# Patient Record
Sex: Female | Born: 1955
Health system: Southern US, Community
[De-identification: ages and names within clinical notes are randomized; demographics above are authoritative.]

## PROBLEM LIST (undated history)

## (undated) DIAGNOSIS — E039 Hypothyroidism, unspecified: Secondary | ICD-10-CM

## (undated) DIAGNOSIS — J449 Chronic obstructive pulmonary disease, unspecified: Secondary | ICD-10-CM

## (undated) DIAGNOSIS — I219 Acute myocardial infarction, unspecified: Secondary | ICD-10-CM

## (undated) DIAGNOSIS — N289 Disorder of kidney and ureter, unspecified: Secondary | ICD-10-CM

## (undated) DIAGNOSIS — I509 Heart failure, unspecified: Secondary | ICD-10-CM

## (undated) DIAGNOSIS — Z951 Presence of aortocoronary bypass graft: Secondary | ICD-10-CM

## (undated) DIAGNOSIS — E079 Disorder of thyroid, unspecified: Secondary | ICD-10-CM

## (undated) DIAGNOSIS — H269 Unspecified cataract: Secondary | ICD-10-CM

## (undated) HISTORY — PX: CORONARY ARTERY BYPASS GRAFT: SHX141

## (undated) HISTORY — PX: TRACHEOSTOMY: SUR1362

## (undated) HISTORY — PX: CATARACT EXTRACTION: SUR2

## (undated) HISTORY — PX: GASTROSTOMY W/ FEEDING TUBE: SUR642

## (undated) HISTORY — PX: OTHER SURGICAL HISTORY: SHX169

## (undated) HISTORY — PX: INSERTION OF DIALYSIS CATHETER: SHX1324

## (undated) HISTORY — PX: CARDIAC SURGERY: SHX584

---

## 2014-06-22 ENCOUNTER — Inpatient Hospital Stay (HOSPITAL_COMMUNITY)
Admission: EM | Admit: 2014-06-22 | Discharge: 2014-06-29 | DRG: 291 | Disposition: A | Discharge status: Home Health | Payer: BC Managed Care – PPO | Attending: Internal Medicine | Admitting: Internal Medicine

## 2014-06-22 DIAGNOSIS — L89899 Pressure ulcer of other site, unspecified stage: Secondary | ICD-10-CM | POA: Diagnosis present

## 2014-06-22 DIAGNOSIS — J398 Other specified diseases of upper respiratory tract: Secondary | ICD-10-CM | POA: Diagnosis present

## 2014-06-22 DIAGNOSIS — I5021 Acute systolic (congestive) heart failure: Principal | ICD-10-CM | POA: Diagnosis present

## 2014-06-22 DIAGNOSIS — E039 Hypothyroidism, unspecified: Secondary | ICD-10-CM | POA: Diagnosis present

## 2014-06-22 DIAGNOSIS — D649 Anemia, unspecified: Secondary | ICD-10-CM | POA: Diagnosis present

## 2014-06-22 DIAGNOSIS — Z6841 Body Mass Index (BMI) 40.0 and over, adult: Secondary | ICD-10-CM

## 2014-06-22 DIAGNOSIS — J449 Chronic obstructive pulmonary disease, unspecified: Secondary | ICD-10-CM | POA: Diagnosis present

## 2014-06-22 DIAGNOSIS — L89153 Pressure ulcer of sacral region, stage 3: Secondary | ICD-10-CM | POA: Diagnosis present

## 2014-06-22 DIAGNOSIS — L89109 Pressure ulcer of unspecified part of back, unspecified stage: Secondary | ICD-10-CM | POA: Diagnosis present

## 2014-06-22 DIAGNOSIS — J9601 Acute respiratory failure with hypoxia: Secondary | ICD-10-CM | POA: Diagnosis present

## 2014-06-22 DIAGNOSIS — D631 Anemia in chronic kidney disease: Secondary | ICD-10-CM | POA: Diagnosis present

## 2014-06-22 DIAGNOSIS — I509 Heart failure, unspecified: Secondary | ICD-10-CM

## 2014-06-22 DIAGNOSIS — Z7982 Long term (current) use of aspirin: Secondary | ICD-10-CM

## 2014-06-22 DIAGNOSIS — Z951 Presence of aortocoronary bypass graft: Secondary | ICD-10-CM

## 2014-06-22 DIAGNOSIS — I340 Nonrheumatic mitral (valve) insufficiency: Secondary | ICD-10-CM | POA: Diagnosis present

## 2014-06-22 DIAGNOSIS — N183 Chronic kidney disease, stage 3 unspecified: Secondary | ICD-10-CM | POA: Diagnosis present

## 2014-06-22 DIAGNOSIS — Z7901 Long term (current) use of anticoagulants: Secondary | ICD-10-CM

## 2014-06-22 DIAGNOSIS — I2510 Atherosclerotic heart disease of native coronary artery without angina pectoris: Secondary | ICD-10-CM | POA: Diagnosis present

## 2014-06-22 DIAGNOSIS — L8990 Pressure ulcer of unspecified site, unspecified stage: Secondary | ICD-10-CM | POA: Diagnosis present

## 2014-06-22 DIAGNOSIS — E46 Unspecified protein-calorie malnutrition: Secondary | ICD-10-CM | POA: Diagnosis present

## 2014-06-22 DIAGNOSIS — Z87891 Personal history of nicotine dependence: Secondary | ICD-10-CM

## 2014-06-22 DIAGNOSIS — F419 Anxiety disorder, unspecified: Secondary | ICD-10-CM | POA: Diagnosis present

## 2014-06-22 DIAGNOSIS — Z86718 Personal history of other venous thrombosis and embolism: Secondary | ICD-10-CM

## 2014-06-22 DIAGNOSIS — R0602 Shortness of breath: Secondary | ICD-10-CM | POA: Diagnosis not present

## 2014-06-22 DIAGNOSIS — I252 Old myocardial infarction: Secondary | ICD-10-CM

## 2014-06-22 DIAGNOSIS — E8809 Other disorders of plasma-protein metabolism, not elsewhere classified: Secondary | ICD-10-CM | POA: Diagnosis present

## 2014-06-22 DIAGNOSIS — E876 Hypokalemia: Secondary | ICD-10-CM | POA: Diagnosis not present

## 2014-06-22 DIAGNOSIS — J189 Pneumonia, unspecified organism: Secondary | ICD-10-CM | POA: Diagnosis present

## 2014-06-22 DIAGNOSIS — L89819 Pressure ulcer of head, unspecified stage: Secondary | ICD-10-CM | POA: Diagnosis present

## 2014-06-22 DIAGNOSIS — J810 Acute pulmonary edema: Secondary | ICD-10-CM

## 2014-06-22 DIAGNOSIS — Z79899 Other long term (current) drug therapy: Secondary | ICD-10-CM

## 2014-06-22 HISTORY — DX: Disorder of kidney and ureter, unspecified: N28.9

## 2014-06-22 HISTORY — DX: Presence of aortocoronary bypass graft: Z95.1

## 2014-06-22 HISTORY — DX: Chronic obstructive pulmonary disease, unspecified: J44.9

## 2014-06-22 HISTORY — DX: Disorder of thyroid, unspecified: E07.9

## 2014-06-22 HISTORY — DX: Heart failure, unspecified: I50.9

## 2014-06-22 HISTORY — DX: Acute myocardial infarction, unspecified: I21.9

## 2014-06-22 HISTORY — DX: Hypothyroidism, unspecified: E03.9

## 2014-06-22 NOTE — ED Notes (Signed)
Patient arrived via [facility] from home with acute onset respiratory distress. EMS received call for a fall and found patient on the floor where she slid out of her chair onto the floor and tripod position in respiratory distress with sats 84%. Patient received albuterol 2.5mg  x1, solumedrol 125mg  by EMS and was placed on CPap with oxygen sats 97%. Patient was just discharged for [facility] two days ago per EMS post MI.

## 2014-06-22 NOTE — ED Provider Notes (Signed)
CSN: [id number]     Arrival date & time 06/22/14  2343 History   First MD Initiated Contact with Patient 06/22/14 2349     Chief Complaint  Patient presents with  . Respiratory Distress     (Consider location/radiation/quality/duration/timing/severity/associated sxs/prior Treatment) Patient is a 58 y.o. female presenting with shortness of breath.  Shortness of Breath Severity:  Severe Onset quality:  Gradual Duration:  2 days Timing:  Constant Progression:  Unchanged Chronicity:  Chronic Context comment:  Recent hospitalization for same Relieved by:  Nothing Worsened by:  Nothing tried Ineffective treatments:  None tried Associated symptoms: cough   Associated symptoms: no abdominal pain, no chest pain and no fever     Past Medical History  Diagnosis Date  . COPD (chronic obstructive pulmonary disease)   . MI (myocardial infarction)   . CHF (congestive heart failure)   . Hx of CABG   . Renal disorder   . Thyroid disease   . Hypothyroid    Past Surgical History  Procedure Laterality Date  . Insertion of dialysis catheter    . Cardiac surgery    . Tracheostomy    . Coronary artery bypass graft     Family History  Problem Relation Age of Onset  . CAD Neg Hx    History  Substance Use Topics  . Smoking status: Former Smoker    Quit date: 02/06/2014  . Smokeless tobacco: Never Used  . Alcohol Use: No   OB History   Grav Para Term Preterm Abortions TAB SAB Ect Mult Living                 Review of Systems  Unable to perform ROS: Acuity of condition  Constitutional: Negative for fever.  Respiratory: Positive for cough and shortness of breath.   Cardiovascular: Negative for chest pain.  Gastrointestinal: Negative for abdominal pain.      Allergies  Review of patient's allergies indicates no known allergies.  Home Medications   Prior to Admission medications   Medication Sig Start Date End Date Taking? Authorizing Provider  ALPRAZolam Duanne Moron) 1 MG  tablet Take 1 mg by mouth 2 (two) times daily.   Yes Historical Provider, MD  aspirin EC 81 MG tablet Take 81 mg by mouth daily.   Yes Historical Provider, MD  furosemide (LASIX) 40 MG tablet Take 40 mg by mouth daily.   Yes Historical Provider, MD  levothyroxine (SYNTHROID, LEVOTHROID) 125 MCG tablet Take 125 mcg by mouth daily before breakfast.   Yes Historical Provider, MD  metoprolol tartrate (LOPRESSOR) 25 MG tablet Take 12.5 mg by mouth 2 (two) times daily.   Yes Historical Provider, MD  morphine (MSIR) 15 MG tablet Take 15 mg by mouth every 4 (four) hours as needed for severe pain.   Yes Historical Provider, MD  pantoprazole (PROTONIX) 40 MG tablet Take 40 mg by mouth daily.   Yes Historical Provider, MD  warfarin (COUMADIN) 4 MG tablet Take 4 mg by mouth daily.   Yes Historical Provider, MD   BP 98/64  Pulse 79  Temp(Src) 99.9 F (37.7 C) (Oral)  Resp 20  SpO2 98% Physical Exam  Vitals reviewed. Constitutional: She is oriented to person, place, and time. She appears well-developed and well-nourished.  HENT:  Head: Normocephalic and atraumatic.  Right Ear: External ear normal.  Left Ear: External ear normal.  Eyes: Conjunctivae and EOM are normal. Pupils are equal, round, and reactive to light.  Neck: Normal range of motion. Neck supple.  Cardiovascular: Regular rhythm, normal heart sounds and intact distal pulses.  Tachycardia present.   Pulmonary/Chest: Accessory muscle usage present. Tachypnea noted. She is in respiratory distress. She has wheezes (diffuse).  Abdominal: Soft. Bowel sounds are normal. There is no tenderness.  Musculoskeletal: Normal range of motion.  Neurological: She is alert and oriented to person, place, and time.  Skin: Skin is warm and dry.  Ulceration to lumbar area, R tib    ED Course  Procedures (including critical care time) Labs Review Labs Reviewed  COMPREHENSIVE METABOLIC PANEL - Abnormal; Notable for the following:    Glucose, Bld 156 (*)     Creatinine, Ser 1.58 (*)    Albumin 2.8 (*)    GFR calc non Af Amer 35 (*)    GFR calc Af Amer 41 (*)    All other components within normal limits  CBC WITH DIFFERENTIAL - Abnormal; Notable for the following:    WBC 11.2 (*)    RBC 3.03 (*)    Hemoglobin 9.4 (*)    HCT 29.5 (*)    RDW 16.0 (*)    Platelets 415 (*)    Neutrophils Relative % 83 (*)    Neutro Abs 9.3 (*)    Lymphocytes Relative 10 (*)    All other components within normal limits  PRO B NATRIURETIC PEPTIDE - Abnormal; Notable for the following:    Pro B Natriuretic peptide (BNP) 12034.0 (*)    All other components within normal limits  PROTIME-INR - Abnormal; Notable for the following:    Prothrombin Time 22.6 (*)    INR 1.97 (*)    All other components within normal limits  I-STAT ARTERIAL BLOOD GAS, ED - Abnormal; Notable for the following:    pO2, Arterial 71.0 (*)    Bicarbonate 25.9 (*)    All other components within normal limits  CULTURE, BLOOD (ROUTINE X 2)  CULTURE, BLOOD (ROUTINE X 2)  URINALYSIS, ROUTINE W REFLEX MICROSCOPIC  TROPONIN I  LACTIC ACID, PLASMA  I-STAT CG4 LACTIC ACID, ED    Imaging Review Ct Angio Chest W/cm &/or Wo Cm  06/23/2014   CLINICAL DATA:  Respiratory distress and hypoxia.  EXAM: CT ANGIOGRAPHY CHEST WITH CONTRAST  TECHNIQUE: Multidetector CT imaging of the chest was performed using the standard protocol during bolus administration of intravenous contrast. Multiplanar CT image reconstructions and MIPs were obtained to evaluate the vascular anatomy.  CONTRAST:  8mL OMNIPAQUE IOHEXOL 350 MG/ML SOLN  COMPARISON:  None.  FINDINGS: THORACIC INLET/BODY WALL:  Dialysis catheter from the right, with tip at the upper right atrium.  There is scarring in keeping with a previous tracheostomy. There is narrowing of the trachea at the thoracic inlet to 9 x 4 mm. No infiltrating mass is seen in this region.  MEDIASTINUM:  Cardiomegaly, with notable enlargement of the left heart. There is  diffuse atherosclerosis, including the coronary arteries. The patient is status post CABG. Contrast timing results in limited opacification of the aorta and saphenous grafts. No evidence for aortic dissection or aneurysm. There is no evidence for pulmonary embolism. No adenopathy.  LUNG WINDOWS:  Small, predominantly layering bilateral pleural effusions with passive atelectasis. There is mild interlobular septal thickening at the bases. Patchy opacity in the upper lobes which roughly conform to secondary pulmonary lobules. These are fairly symmetric and distribution.  UPPER ABDOMEN:  No acute findings.  OSSEOUS:  No acute fracture.  No suspicious lytic or blastic lesions.  Review of the MIP images confirms the  above findings.  IMPRESSION: 1. Negative for pulmonary embolism. 2. High-grade tracheal stenosis at the thoracic inlet, likely scarring from remote tracheostomy tube. Minimal diameter measures 9 x 4 mm. 3. Patchy bilateral airspace disease favors pulmonary edema over atypical infection. There are small bilateral pleural effusions.   Electronically Signed   By: Jorje Guild M.D.   On: 06/23/2014 03:42   Dg Chest Portable 1 View  06/23/2014   CLINICAL DATA:  Respiratory distress.  Initial encounter.  EXAM: PORTABLE CHEST - 1 VIEW  COMPARISON:  02/16/2014  FINDINGS: Grossly unchanged enlarged cardiac silhouette and mediastinal contours. Post median sternotomy and CABG. Interval placement of a right subclavian vein approach dialysis catheter with tip projected over the superior cavoatrial junction. The pulmonary vasculature is indistinct with cephalization of flow. Interval development small bilateral effusions and associated bibasilar heterogeneous/consolidative opacities, left greater than right. No pneumothorax. Unchanged bones.  IMPRESSION: Findings worrisome for pulmonary edema with small bilateral effusions and associated bibasilar opacities, left greater than right, atelectasis versus infiltrate.    Electronically Signed   By: Sandi Mariscal M.D.   On: 06/23/2014 00:44     EKG Interpretation   Date/Time:  Thursday June 22 2014 23:52:09 EDT Ventricular Rate:  97 PR Interval:  133 QRS Duration: 105 QT Interval:  353 QTC Calculation: 448 R Axis:   -28 Text Interpretation:  Sinus rhythm Ventricular premature complex  Borderline left axis deviation Low voltage, extremity leads Abnormal  R-wave progression, early transition Nonspecific T abnormalities, lateral  leads No old tracing to compare Confirmed by Debby Freiberg [phone]) on  06/23/2014 1:11:51 AM      MDM   Final diagnoses:  CHF exacerbation  Anemia, unspecified anemia type  CAD in native artery  Hypothyroidism, unspecified hypothyroidism type    58 y.o. female with pertinent PMH of COPD, CHF, CAD (recent MI in 02/2014 with prolonged respiratory failure and trach) presents with recurrent respiratory symptoms beginning at 9 PM this afternoon. Patient has been short of breath for some time, however became too weak to ambulate or stand, EMS was then called. On EMS arrival patient to 84% on room air.  Pt was given albuterol and solumedrol with improvement to 9%% on 6L.  On arrival she had vitals and physical exam as above.  Blood gas obtained and with hypoxia. CT scan obtained to ro PE.  This demonstrated no PE.  Labs and exam consistent with CHF.  Lasix given.  Consulted medicine for admission after abx to cover for possible PNA.  1. CHF exacerbation   2. Anemia, unspecified anemia type   3. CAD in native artery   4. Hypothyroidism, unspecified hypothyroidism type         Debby Freiberg, MD 06/23/14 [phone]

## 2014-06-23 ENCOUNTER — Emergency Department (HOSPITAL_COMMUNITY): Payer: BC Managed Care – PPO

## 2014-06-23 ENCOUNTER — Encounter (HOSPITAL_COMMUNITY): Payer: Self-pay | Admitting: Emergency Medicine

## 2014-06-23 DIAGNOSIS — J449 Chronic obstructive pulmonary disease, unspecified: Secondary | ICD-10-CM | POA: Diagnosis present

## 2014-06-23 DIAGNOSIS — I340 Nonrheumatic mitral (valve) insufficiency: Secondary | ICD-10-CM | POA: Diagnosis present

## 2014-06-23 DIAGNOSIS — N183 Chronic kidney disease, stage 3 unspecified: Secondary | ICD-10-CM | POA: Diagnosis present

## 2014-06-23 DIAGNOSIS — Z7982 Long term (current) use of aspirin: Secondary | ICD-10-CM | POA: Diagnosis not present

## 2014-06-23 DIAGNOSIS — L8990 Pressure ulcer of unspecified site, unspecified stage: Secondary | ICD-10-CM | POA: Diagnosis present

## 2014-06-23 DIAGNOSIS — R0602 Shortness of breath: Secondary | ICD-10-CM | POA: Diagnosis present

## 2014-06-23 DIAGNOSIS — L89109 Pressure ulcer of unspecified part of back, unspecified stage: Secondary | ICD-10-CM | POA: Diagnosis present

## 2014-06-23 DIAGNOSIS — E46 Unspecified protein-calorie malnutrition: Secondary | ICD-10-CM | POA: Diagnosis present

## 2014-06-23 DIAGNOSIS — L89819 Pressure ulcer of head, unspecified stage: Secondary | ICD-10-CM | POA: Diagnosis present

## 2014-06-23 DIAGNOSIS — Z86718 Personal history of other venous thrombosis and embolism: Secondary | ICD-10-CM

## 2014-06-23 DIAGNOSIS — Z6841 Body Mass Index (BMI) 40.0 and over, adult: Secondary | ICD-10-CM | POA: Diagnosis not present

## 2014-06-23 DIAGNOSIS — I5021 Acute systolic (congestive) heart failure: Secondary | ICD-10-CM | POA: Diagnosis present

## 2014-06-23 DIAGNOSIS — J9601 Acute respiratory failure with hypoxia: Secondary | ICD-10-CM | POA: Diagnosis present

## 2014-06-23 DIAGNOSIS — E8809 Other disorders of plasma-protein metabolism, not elsewhere classified: Secondary | ICD-10-CM | POA: Diagnosis present

## 2014-06-23 DIAGNOSIS — E039 Hypothyroidism, unspecified: Secondary | ICD-10-CM | POA: Diagnosis present

## 2014-06-23 DIAGNOSIS — Z7901 Long term (current) use of anticoagulants: Secondary | ICD-10-CM | POA: Diagnosis not present

## 2014-06-23 DIAGNOSIS — Z87891 Personal history of nicotine dependence: Secondary | ICD-10-CM | POA: Diagnosis not present

## 2014-06-23 DIAGNOSIS — J398 Other specified diseases of upper respiratory tract: Secondary | ICD-10-CM | POA: Diagnosis present

## 2014-06-23 DIAGNOSIS — D631 Anemia in chronic kidney disease: Secondary | ICD-10-CM | POA: Diagnosis present

## 2014-06-23 DIAGNOSIS — Z951 Presence of aortocoronary bypass graft: Secondary | ICD-10-CM

## 2014-06-23 DIAGNOSIS — I252 Old myocardial infarction: Secondary | ICD-10-CM

## 2014-06-23 DIAGNOSIS — L89899 Pressure ulcer of other site, unspecified stage: Secondary | ICD-10-CM | POA: Diagnosis present

## 2014-06-23 DIAGNOSIS — L89153 Pressure ulcer of sacral region, stage 3: Secondary | ICD-10-CM | POA: Diagnosis present

## 2014-06-23 DIAGNOSIS — J189 Pneumonia, unspecified organism: Secondary | ICD-10-CM | POA: Diagnosis present

## 2014-06-23 DIAGNOSIS — D649 Anemia, unspecified: Secondary | ICD-10-CM

## 2014-06-23 DIAGNOSIS — I2510 Atherosclerotic heart disease of native coronary artery without angina pectoris: Secondary | ICD-10-CM | POA: Diagnosis present

## 2014-06-23 DIAGNOSIS — F419 Anxiety disorder, unspecified: Secondary | ICD-10-CM | POA: Diagnosis present

## 2014-06-23 DIAGNOSIS — Z79899 Other long term (current) drug therapy: Secondary | ICD-10-CM | POA: Diagnosis not present

## 2014-06-23 DIAGNOSIS — E876 Hypokalemia: Secondary | ICD-10-CM | POA: Diagnosis not present

## 2014-06-23 DIAGNOSIS — I509 Heart failure, unspecified: Secondary | ICD-10-CM

## 2014-06-23 LAB — I-STAT ARTERIAL BLOOD GAS, ED
Acid-Base Excess: 1 mmol/L (ref 0.0–2.0)
BICARBONATE: 25.9 meq/L — AB (ref 20.0–24.0)
O2 SAT: 94 %
PCO2 ART: 42.1 mmHg (ref 35.0–45.0)
PH ART: 7.396 (ref 7.350–7.450)
PO2 ART: 71 mmHg — AB (ref 80.0–100.0)
Patient temperature: 98.6
TCO2: 27 mmol/L (ref 0–100)

## 2014-06-23 LAB — CBC WITH DIFFERENTIAL/PLATELET
BASOS ABS: 0 10*3/uL (ref 0.0–0.1)
BASOS PCT: 1 % (ref 0–1)
Basophils Absolute: 0.1 10*3/uL (ref 0.0–0.1)
Basophils Relative: 0 % (ref 0–1)
EOS ABS: 0.2 10*3/uL (ref 0.0–0.7)
EOS PCT: 1 % (ref 0–5)
Eosinophils Absolute: 0 10*3/uL (ref 0.0–0.7)
Eosinophils Relative: 0 % (ref 0–5)
HCT: 29.5 % — ABNORMAL LOW (ref 36.0–46.0)
HEMATOCRIT: 27.8 % — AB (ref 36.0–46.0)
HEMOGLOBIN: 9.4 g/dL — AB (ref 12.0–15.0)
Hemoglobin: 8.8 g/dL — ABNORMAL LOW (ref 12.0–15.0)
LYMPHS ABS: 1.1 10*3/uL (ref 0.7–4.0)
LYMPHS PCT: 10 % — AB (ref 12–46)
Lymphocytes Relative: 10 % — ABNORMAL LOW (ref 12–46)
Lymphs Abs: 0.9 10*3/uL (ref 0.7–4.0)
MCH: 30.3 pg (ref 26.0–34.0)
MCH: 31 pg (ref 26.0–34.0)
MCHC: 31.7 g/dL (ref 30.0–36.0)
MCHC: 31.9 g/dL (ref 30.0–36.0)
MCV: 95.9 fL (ref 78.0–100.0)
MCV: 97.4 fL (ref 78.0–100.0)
MONO ABS: 0.3 10*3/uL (ref 0.1–1.0)
MONOS PCT: 5 % (ref 3–12)
Monocytes Absolute: 0.6 10*3/uL (ref 0.1–1.0)
Monocytes Relative: 3 % (ref 3–12)
NEUTROS PCT: 87 % — AB (ref 43–77)
Neutro Abs: 7.9 10*3/uL — ABNORMAL HIGH (ref 1.7–7.7)
Neutro Abs: 9.3 10*3/uL — ABNORMAL HIGH (ref 1.7–7.7)
Neutrophils Relative %: 83 % — ABNORMAL HIGH (ref 43–77)
PLATELETS: 415 10*3/uL — AB (ref 150–400)
Platelets: 378 10*3/uL (ref 150–400)
RBC: 2.9 MIL/uL — ABNORMAL LOW (ref 3.87–5.11)
RBC: 3.03 MIL/uL — ABNORMAL LOW (ref 3.87–5.11)
RDW: 15.5 % (ref 11.5–15.5)
RDW: 16 % — ABNORMAL HIGH (ref 11.5–15.5)
WBC: 11.2 10*3/uL — ABNORMAL HIGH (ref 4.0–10.5)
WBC: 9 10*3/uL (ref 4.0–10.5)

## 2014-06-23 LAB — PROTIME-INR
INR: 1.97 — ABNORMAL HIGH (ref 0.00–1.49)
PROTHROMBIN TIME: 22.6 s — AB (ref 11.6–15.2)

## 2014-06-23 LAB — COMPREHENSIVE METABOLIC PANEL
ALBUMIN: 2.8 g/dL — AB (ref 3.5–5.2)
ALK PHOS: 59 U/L (ref 39–117)
ALT: 11 U/L (ref 0–35)
ALT: 12 U/L (ref 0–35)
ANION GAP: 13 (ref 5–15)
AST: 13 U/L (ref 0–37)
AST: 16 U/L (ref 0–37)
Albumin: 2.8 g/dL — ABNORMAL LOW (ref 3.5–5.2)
Alkaline Phosphatase: 55 U/L (ref 39–117)
Anion gap: 13 (ref 5–15)
BILIRUBIN TOTAL: 0.3 mg/dL (ref 0.3–1.2)
BUN: 17 mg/dL (ref 6–23)
BUN: 21 mg/dL (ref 6–23)
CHLORIDE: 102 meq/L (ref 96–112)
CO2: 24 mEq/L (ref 19–32)
CO2: 26 mEq/L (ref 19–32)
Calcium: 8.7 mg/dL (ref 8.4–10.5)
Calcium: 9.1 mg/dL (ref 8.4–10.5)
Chloride: 103 mEq/L (ref 96–112)
Creatinine, Ser: 1.58 mg/dL — ABNORMAL HIGH (ref 0.50–1.10)
Creatinine, Ser: 1.68 mg/dL — ABNORMAL HIGH (ref 0.50–1.10)
GFR calc Af Amer: 38 mL/min — ABNORMAL LOW (ref >90)
GFR calc non Af Amer: 33 mL/min — ABNORMAL LOW (ref >90)
GFR, EST AFRICAN AMERICAN: 41 mL/min — AB (ref >90)
GFR, EST NON AFRICAN AMERICAN: 35 mL/min — AB (ref >90)
GLUCOSE: 156 mg/dL — AB (ref 70–99)
Glucose, Bld: 179 mg/dL — ABNORMAL HIGH (ref 70–99)
POTASSIUM: 4.1 meq/L (ref 3.7–5.3)
Potassium: 4.7 mEq/L (ref 3.7–5.3)
Sodium: 140 mEq/L (ref 137–147)
Sodium: 141 mEq/L (ref 137–147)
TOTAL PROTEIN: 6.3 g/dL (ref 6.0–8.3)
Total Bilirubin: 0.3 mg/dL (ref 0.3–1.2)
Total Protein: 6.4 g/dL (ref 6.0–8.3)

## 2014-06-23 LAB — TROPONIN I: Troponin I: <0.3 ng/mL (ref <0.30)

## 2014-06-23 LAB — I-STAT CG4 LACTIC ACID, ED: Lactic Acid, Venous: 0.56 mmol/L (ref 0.5–2.2)

## 2014-06-23 LAB — URINALYSIS, ROUTINE W REFLEX MICROSCOPIC
Bilirubin Urine: NEGATIVE
GLUCOSE, UA: NEGATIVE mg/dL
HGB URINE DIPSTICK: NEGATIVE
KETONES UR: NEGATIVE mg/dL
Leukocytes, UA: NEGATIVE
Nitrite: NEGATIVE
PROTEIN: NEGATIVE mg/dL
Specific Gravity, Urine: 1.009 (ref 1.005–1.030)
UROBILINOGEN UA: 0.2 mg/dL (ref 0.0–1.0)
pH: 5 (ref 5.0–8.0)

## 2014-06-23 LAB — INFLUENZA PANEL BY PCR (TYPE A & B)
H1N1 flu by pcr: NOT DETECTED
Influenza A By PCR: NEGATIVE
Influenza B By PCR: NEGATIVE

## 2014-06-23 LAB — LACTIC ACID, PLASMA: Lactic Acid, Venous: 0.8 mmol/L (ref 0.5–2.2)

## 2014-06-23 LAB — HEMOGLOBIN A1C
Hgb A1c MFr Bld: 6 % — ABNORMAL HIGH
Mean Plasma Glucose: 126 mg/dL — ABNORMAL HIGH

## 2014-06-23 LAB — TSH: TSH: 6.84 u[IU]/mL — AB (ref 0.350–4.500)

## 2014-06-23 LAB — MRSA PCR SCREENING: MRSA by PCR: NEGATIVE

## 2014-06-23 LAB — PRO B NATRIURETIC PEPTIDE: Pro B Natriuretic peptide (BNP): 12034 pg/mL — ABNORMAL HIGH (ref 0–125)

## 2014-06-23 MED ORDER — VANCOMYCIN HCL 10 G IV SOLR
1250.0000 mg | INTRAVENOUS | Status: DC
  Administered 2014-06-24: 1250 mg via INTRAVENOUS
  Filled 2014-06-23: qty 1250

## 2014-06-23 MED ORDER — DEXTROSE 5 % IV SOLN
2.0000 g | INTRAVENOUS | Status: DC
  Administered 2014-06-23: 2 g via INTRAVENOUS
  Filled 2014-06-23 (×3): qty 2

## 2014-06-23 MED ORDER — ACETAMINOPHEN 650 MG RE SUPP: 650.0000 mg | Freq: Four times a day (QID) | RECTAL | Status: DC | PRN

## 2014-06-23 MED ORDER — FUROSEMIDE 10 MG/ML IJ SOLN
40.0000 mg | Freq: Two times a day (BID) | INTRAMUSCULAR | Status: DC
  Administered 2014-06-23: 40 mg via INTRAVENOUS
  Filled 2014-06-23: qty 4

## 2014-06-23 MED ORDER — ALPRAZOLAM 0.5 MG PO TABS
1.0000 mg | ORAL_TABLET | Freq: Two times a day (BID) | ORAL | Status: DC
  Administered 2014-06-23 – 2014-06-29 (×12): 1 mg via ORAL
  Filled 2014-06-23 (×6): qty 2
  Filled 2014-06-23: qty 4
  Filled 2014-06-23 (×6): qty 2

## 2014-06-23 MED ORDER — SODIUM CHLORIDE 0.9 % IJ SOLN
3.0000 mL | Freq: Two times a day (BID) | INTRAMUSCULAR | Status: DC
  Administered 2014-06-23 – 2014-06-29 (×12): 3 mL via INTRAVENOUS

## 2014-06-23 MED ORDER — ONDANSETRON HCL 4 MG PO TABS: 4.0000 mg | ORAL_TABLET | Freq: Four times a day (QID) | ORAL | Status: DC | PRN

## 2014-06-23 MED ORDER — FUROSEMIDE 10 MG/ML IJ SOLN
40.0000 mg | Freq: Once | INTRAMUSCULAR | Status: AC
  Administered 2014-06-23: 40 mg via INTRAVENOUS
  Filled 2014-06-23: qty 4

## 2014-06-23 MED ORDER — WARFARIN SODIUM 6 MG PO TABS
6.0000 mg | ORAL_TABLET | Freq: Once | ORAL | Status: AC
  Administered 2014-06-23: 6 mg via ORAL
  Filled 2014-06-23 (×2): qty 1

## 2014-06-23 MED ORDER — MORPHINE SULFATE 15 MG PO TABS
15.0000 mg | ORAL_TABLET | ORAL | Status: DC | PRN
  Administered 2014-06-23 – 2014-06-28 (×6): 15 mg via ORAL
  Filled 2014-06-23 (×6): qty 1

## 2014-06-23 MED ORDER — ACETAMINOPHEN 325 MG PO TABS: 650.0000 mg | ORAL_TABLET | Freq: Four times a day (QID) | ORAL | Status: DC | PRN

## 2014-06-23 MED ORDER — FUROSEMIDE 10 MG/ML IJ SOLN: 40.0000 mg | Freq: Every day | INTRAMUSCULAR | Status: DC

## 2014-06-23 MED ORDER — METOPROLOL TARTRATE 12.5 MG HALF TABLET
12.5000 mg | ORAL_TABLET | Freq: Two times a day (BID) | ORAL | Status: DC
  Administered 2014-06-23 – 2014-06-24 (×2): 12.5 mg via ORAL
  Filled 2014-06-23 (×4): qty 1

## 2014-06-23 MED ORDER — PIPERACILLIN-TAZOBACTAM 3.375 G IVPB 30 MIN
3.3750 g | Freq: Once | INTRAVENOUS | Status: AC
  Administered 2014-06-23: 3.375 g via INTRAVENOUS
  Filled 2014-06-23: qty 50

## 2014-06-23 MED ORDER — ASPIRIN EC 81 MG PO TBEC
81.0000 mg | DELAYED_RELEASE_TABLET | Freq: Every day | ORAL | Status: DC
  Administered 2014-06-23: 81 mg via ORAL
  Filled 2014-06-23: qty 1

## 2014-06-23 MED ORDER — ONDANSETRON HCL 4 MG/2ML IJ SOLN: 4.0000 mg | Freq: Four times a day (QID) | INTRAMUSCULAR | Status: DC | PRN

## 2014-06-23 MED ORDER — FUROSEMIDE 10 MG/ML IJ SOLN
80.0000 mg | Freq: Two times a day (BID) | INTRAMUSCULAR | Status: DC
  Administered 2014-06-23 – 2014-06-25 (×5): 80 mg via INTRAVENOUS
  Filled 2014-06-23 (×7): qty 8

## 2014-06-23 MED ORDER — PANTOPRAZOLE SODIUM 40 MG PO TBEC
40.0000 mg | DELAYED_RELEASE_TABLET | Freq: Every day | ORAL | Status: DC
  Administered 2014-06-23 – 2014-06-29 (×7): 40 mg via ORAL
  Filled 2014-06-23 (×7): qty 1

## 2014-06-23 MED ORDER — WARFARIN - PHARMACIST DOSING INPATIENT
Freq: Every day | Status: DC
  Administered 2014-06-23 – 2014-06-27 (×5)

## 2014-06-23 MED ORDER — ASPIRIN EC 81 MG PO TBEC
81.0000 mg | DELAYED_RELEASE_TABLET | Freq: Every day | ORAL | Status: DC
  Administered 2014-06-24 – 2014-06-29 (×6): 81 mg via ORAL
  Filled 2014-06-23 (×7): qty 1

## 2014-06-23 MED ORDER — LEVOTHYROXINE SODIUM 125 MCG PO TABS
125.0000 ug | ORAL_TABLET | Freq: Every day | ORAL | Status: DC
  Administered 2014-06-24 – 2014-06-29 (×6): 125 ug via ORAL
  Filled 2014-06-23 (×7): qty 1

## 2014-06-23 MED ORDER — IOHEXOL 350 MG/ML SOLN
100.0000 mL | Freq: Once | INTRAVENOUS | Status: AC | PRN
  Administered 2014-06-23: 80 mL via INTRAVENOUS

## 2014-06-23 MED ORDER — VANCOMYCIN HCL 10 G IV SOLR
1500.0000 mg | Freq: Once | INTRAVENOUS | Status: AC
  Administered 2014-06-23: 1500 mg via INTRAVENOUS
  Filled 2014-06-23: qty 1500

## 2014-06-23 NOTE — ED Notes (Signed)
Patient has a wound to her right lower leg that is a half dollar in size has packing in place that from where they harvested her vein for CABG, area is red with yellow/green pus and warm. She has a blister just below it that is red and apprx 2-3 cm around. She has a wound to her lower back family states was from previous hospital that has a dressing in place. She also has a wound to the back of her head that is red and is apprx 1-2 cm around.

## 2014-06-23 NOTE — ED Notes (Signed)
Patient states she had a panic attack earlier and ever since she has not been able to catch her breath. She was trying to stand to go to the restroom with help and she just became weak and slid to the floor with family help. Denies hitting her head. No injury noted.

## 2014-06-23 NOTE — ED Notes (Signed)
Admitting md at bedside to eval pt 

## 2014-06-23 NOTE — Progress Notes (Signed)
[name]  Laura Lane OIT:[id number] DOB: 05/06/1956 DOA: 06/22/2014 PCP: No primary provider on file.  Brief narrative: 58 year old female patient with significant history of prolonged hospitalization at [facility] beginning in June of 2015. The admit started with an acute MI. Patient had a cardiac catheterization which was complicated by a coronary artery dissection prompting emergency bypass procedure. Patient had prolonged mechanical ventilation that required tracheostomy. She also had acute renal failure requiring dialysis. In addition she was found to have a right upper extremity DVT and remains on Coumadin for this. She was discharged one week prior to this admit from a rehabilitation facility and sent home. On the night of admission patient developed shortness of breath. Apparently this had been subtly present at time of discharge but had worsened on the night of admission. Because of this she came to the emergency department.  In the ER patient was hypoxemic on room air. CT of the chest had findings concerning for CHF versus atypical infection. She was given a dose of IV Lasix in the ER. She was also mildly febrile so blood cultures were obtained and giving her respiratory findings there was concern she may have pneumonia so she was started on empiric broad-spectrum antibiotics. Patient states her last dialysis was prior to discharge - she still has a temporary dialysis catheter in place in the upper chest wall. The ER physician documented that the patient's primary care physician has advised her to start Lasix but she had not done that yet.  In addition to the findings concerning for possible CHF versus atypical infection the CT also revealed a degree of tracheal stenosis at the thoracic inlet minimal diameter measures 9 x 4 mm.  HPI/Subjective: Patient sleepy and endorsing generalize fatigue and shortness of breath especially with activity.  Denies orthopnea.  Assessment/Plan:  Acute respiratory failure with hypoxia due to:   A) Pulmonary edema   B) Tracheal stenosis   C) ? HCAP  Limited echocardiogram on 03/14/14 with normal EF 50-55% with lateral wall hypokinesis and no apparent diastolic dysfunction - on exam patient appears very volume overloaded with dependent edema more marked in the lower extremities - patient hypoalbuminemic and required dialysis during her last hospitalization - continue Lasix but increase to twice daily and given poor GFR increase dosage from 40 mg to 80 mg - current BP soft with systolic readings in the 58X so watch closely  - her weight on 06/17/14 was documented as 93.8 kg  - patient also has tracheal stenosis and edema could be flash edema as well related to mechanical difficulty with inspiratory effort - will check peak flows but may need formal pulmonology consultation for consideration for direct visualization with bronchoscopy -chart from for [facility] reviewed and while trach in place patient did have bleeding from the trach and underwent bronchoscopy at that time with no significant findings or masses noted -continuing empiric antibiotics for now - given her renal function Procalcitonin likely not helpful  Mitral regurgitation (moderate) Noted on the echocardiogram from 7/7 - this could potentially be contributing to her respiratory symptoms as well - follow  Anemia Baseline hemoglobin appears to be between 8.9 and 9.8 - current hemoglobin stable at 9.4  Chronic kidney disease stage III Baseline creatinine 1.7 on 10/10 - current creatinine 1.58  Multiple Decubitus skin ulcers Patient with multiple decubitus ulcers as follows: 2 on the right lower medial calf, one on the posterior scalp, a large/deep horizontal ulcer  stage 3-4 on the low back, and a small sacral ulcer - wound care RN has been consulted  History of RUE DVT  Diagnosed by venous duplex 06/06/14 - cont anticoag  Chronic  anticoagulation Coumadin per pharmacy - INR 1.97 with a PT of 22.6  History of MI /S/P CABG Symptoms do not appear to be ischemic in nature - enzymes have been negative thus far  Hypothyroidism TSH pending - continue  Morbid obesity with BMI of 45.0-49.9, adult We'll need to discuss weight reduction strategies with the patient - this will be difficult given the fact she is hypoalbuminemic and overall appears to be malnourished - also appears to have a significant amount of volume overload contributing to her current weight   Hypoalbuminemia/protein calorie malnutrition Nutrition consultation   DVT prophylaxis: Warfarin Code Status: Full Family Communication: No family at bedside Disposition Plan/Expected LOS: Stepdown   Consultants: None  Procedures: None  Cultures: Blood cultures x2 pending  Antibiotics: Zosyn 10/15 Vancomycin 10/15 Maxipime 10/16 >  Objective: Blood pressure 96/55, pulse 62, temperature 97.2 F (36.2 C), temperature source Oral, resp. rate 25, height 4\' 11"  (1.499 m), weight 226 lb 3.1 oz (102.6 kg), SpO2 100.00%.  Intake/Output Summary (Last 24 hours) at 06/23/14 1456 Last data filed at 06/23/14 1010  Gross per 24 hour  Intake      0 ml  Output    700 ml  Net   -700 ml   Exam: Follow up exam completed  Scheduled Meds:  Scheduled Meds: . ALPRAZolam  1 mg Oral BID  . aspirin EC  81 mg Oral Daily  . aspirin EC  81 mg Oral Daily  . ceFEPime (MAXIPIME) IV  2 g Intravenous Q24H  . furosemide  40 mg Intravenous Q12H  . [START ON 06/24/2014] levothyroxine  125 mcg Oral QAC breakfast  . metoprolol tartrate  12.5 mg Oral BID  . pantoprazole  40 mg Oral Daily  . sodium chloride  3 mL Intravenous Q12H  . [START ON 06/24/2014] vancomycin  1,250 mg Intravenous Q24H  . warfarin  6 mg Oral ONCE-1800  . Warfarin - Pharmacist Dosing Inpatient   Does not apply q1800   Data Reviewed: Basic Metabolic Panel:  Recent Labs Lab 06/23/14 0011  NA 140    K 4.1  CL 103  CO2 24  GLUCOSE 156*  BUN 17  CREATININE 1.58*  CALCIUM 8.7   Liver Function Tests:  Recent Labs Lab 06/23/14 0011  AST 16  ALT 12  ALKPHOS 59  BILITOT 0.3  PROT 6.4  ALBUMIN 2.8*   CBC:  Recent Labs Lab 06/23/14 0011  WBC 11.2*  NEUTROABS 9.3*  HGB 9.4*  HCT 29.5*  MCV 97.4  PLT 415*   Cardiac Enzymes:  Recent Labs Lab 06/23/14 0011 06/23/14 0750  TROPONINI <0.30 <0.30   BNP (last 3 results)  Recent Labs  06/23/14 0011  PROBNP 12034.0*    Recent Results (from the past 240 hour(s))  MRSA PCR SCREENING     Status: None   Collection Time    06/23/14 11:46 AM      Result Value Ref Range Status   MRSA by PCR NEGATIVE  NEGATIVE Final   Comment:            The GeneXpert MRSA Assay (FDA     approved for NASAL specimens     only), is one component of a     comprehensive MRSA colonization     surveillance program. It is  not     intended to diagnose MRSA     infection nor to guide or     monitor treatment for     MRSA infections.     Studies:  Recent x-ray studies have been reviewed in detail by the Attending Physician  Time spent :  25+ mins  Erin Hearing, ANP [facility] Office  [phone] Pager [phone]  On-Call/Text Page:      [email or web address]      password [email or web address]  If 7PM-7AM, please contact night-coverage [email or web address] Password [email or web address] 06/23/2014, 2:56 PM   LOS: 1 day   I have personally examined this patient and reviewed the entire database. I have reviewed the above note, made any necessary editorial changes, and agree with its content.  Cherene Altes, MD [facility]

## 2014-06-23 NOTE — Progress Notes (Signed)
Pt performed PEF x 3.  PEF = 130.  Questionable effort despite re-instructions.  No distress noted, pt speaking in full sentences, pt talking on phone.

## 2014-06-23 NOTE — ED Notes (Signed)
Family at bedside. 

## 2014-06-23 NOTE — Consult Note (Signed)
WOC wound consult note Reason for Consult: Consult requested for several wounds.  Pt states they occurred while staying at another facility. Wound type:Posterior back with chronic full thickness wound; 2X15X3cm with undermining to 2 cm at wound edges.  80% red, 20% yellow, no odor, mod amt yellow drainage.   Sacrum (closer to right buttock) with stage 3 wound; .2X.2X.2cm; no odor or drainage, dry red wound bed. Posterior head with unstageable wound 3X4cm, 100% tightly adhered yellow slough Right anterior thigh with full thickness wound; 3X4X.2cm, 80% red, 20% yellow, small amt yellow drainage, no odor Pressure Ulcer POA: All wounds were present on admission. Dressing procedure/placement/frequency: Pt is followed by home health prior to admission and they are "using some ointment in a purple container" for the back of her head.  She can resume this plan of care after discharge since I am not sure what this topical treatment is named.  Avoid pressure to the back of head wound and it can remain open to air.  Moist gauze packing to right leg and back wounds.  Foam dressing to sacrum. Air mattress to decrease pressure.  Pt states she was scheduled to begin to be followed at the outpatient wound care center but became sick and did not get an evaluation yet.  Recommend follow-up at the outpatient wound care center after discharge, please order if desired. Please re-consult if further assistance is needed.  Thank-you,  Julien Girt MSN, [place], [place], [place], [place]

## 2014-06-23 NOTE — Progress Notes (Signed)
Utilization Review Completed.Donne Anon T10/16/2015

## 2014-06-23 NOTE — H&P (Signed)
[facility] History and Physical  Tanairy Payeur XHB:[id number] DOB: 11/15/1955 DOA: 06/22/2014  Referring physician: ER physician. PCP: No primary provider on file.  Chief Complaint: Shortness of breath.  HPI: Laura Lane is a 58 y.o. female with history of recent admission at [facility] in the month of June for acute MI following which patient states that she had catheter which was completed by coronary artery dissection patient had emergency CABG and patient had respiratory failure and was intubated following which patient had tracheostomy and patient also developed renal failure and was on dialysis and right upper extremity DVT for which Coumadin was started on patient was just discharged last week and was sent home when last night patient started feeling short of breath. In the ER patient had CT and exam of the chest which shows features concerning for CHF versus atypical infection. Patient has been admitted for further management. Lasix 40 mg IV one dose was ordered. Since patient was mildly febrile blood cultures have been ordered and patient was started on the antibiotics. Patient states her last dialysis was last week and patient has status catheter still in the chest area. Patient otherwise denies any nausea vomiting abdominal pain diarrhea denies any chest pain productive cough. Patient's care she was originally advised to start Lasix yesterday but she has not yet.   Review of Systems: As presented in the history of presenting illness, rest negative.  Past Medical History  Diagnosis Date  . COPD (chronic obstructive pulmonary disease)   . MI (myocardial infarction)   . CHF (congestive heart failure)   . Hx of CABG   . Renal disorder   . Thyroid disease   . Hypothyroid    Past Surgical History  Procedure Laterality Date  . Insertion of dialysis catheter    . Cardiac surgery    . Tracheostomy    . Coronary artery bypass graft     Social History:  reports that  she quit smoking about 4 months ago. She has never used smokeless tobacco. She reports that she does not drink alcohol or use illicit drugs. Where does patient live  home. Can patient participate in ADLs?  yes.  No Known Allergies  Family History:  Family History  Problem Relation Age of Onset  . CAD Neg Hx       Prior to Admission medications   Medication Sig Start Date End Date Taking? Authorizing Provider  ALPRAZolam Duanne Moron) 1 MG tablet Take 1 mg by mouth 2 (two) times daily.   Yes Historical Provider, MD  aspirin EC 81 MG tablet Take 81 mg by mouth daily.   Yes Historical Provider, MD  furosemide (LASIX) 40 MG tablet Take 40 mg by mouth daily.   Yes Historical Provider, MD  levothyroxine (SYNTHROID, LEVOTHROID) 125 MCG tablet Take 125 mcg by mouth daily before breakfast.   Yes Historical Provider, MD  metoprolol tartrate (LOPRESSOR) 25 MG tablet Take 12.5 mg by mouth 2 (two) times daily.   Yes Historical Provider, MD  morphine (MSIR) 15 MG tablet Take 15 mg by mouth every 4 (four) hours as needed for severe pain.   Yes Historical Provider, MD  pantoprazole (PROTONIX) 40 MG tablet Take 40 mg by mouth daily.   Yes Historical Provider, MD  warfarin (COUMADIN) 4 MG tablet Take 4 mg by mouth daily.   Yes Historical Provider, MD    Physical Exam: Filed Vitals:   06/23/14 0030 06/23/14 0045 06/23/14 0105 06/23/14 0115  BP: 91/54 93/50 100/53 91/57  Pulse: 98 98 99 96  Temp:      TempSrc:      Resp: 22 24 25  34  SpO2: 99% 99% 99% 96%     General:   moderately built and nourished.  Eyes:  anicteric no pallor.  ENT:  no discharge from ears eyes nose mouth  Neck:  no mass felt.   Cardiovascular:  S1-S2 heard.  Respiratory:  no rhonchi or crepitations.  Abdomen:  soft nontender bowel sounds present..  Skin:  skin ecchymosis in the right lower quadrant of abdomen.  Musculoskeletal:  bilateral lower extremity edema.  Psychiatric:  appears normal.  Neurologic:  alert  awake oriented to time place and person. Moves all extremities.  Labs on Admission:  Basic Metabolic Panel:  Recent Labs Lab 06/23/14 0011  NA 140  K 4.1  CL 103  CO2 24  GLUCOSE 156*  BUN 17  CREATININE 1.58*  CALCIUM 8.7   Liver Function Tests:  Recent Labs Lab 06/23/14 0011  AST 16  ALT 12  ALKPHOS 59  BILITOT 0.3  PROT 6.4  ALBUMIN 2.8*   No results found for this basename: LIPASE, AMYLASE,  in the last 168 hours No results found for this basename: AMMONIA,  in the last 168 hours CBC:  Recent Labs Lab 06/23/14 0011  WBC 11.2*  NEUTROABS 9.3*  HGB 9.4*  HCT 29.5*  MCV 97.4  PLT 415*   Cardiac Enzymes:  Recent Labs Lab 06/23/14 0011  TROPONINI <0.30    BNP (last 3 results)  Recent Labs  06/23/14 0011  PROBNP 12034.0*   CBG: No results found for this basename: GLUCAP,  in the last 168 hours  Radiological Exams on Admission: Ct Angio Chest W/cm &/or Wo Cm  06/23/2014   CLINICAL DATA:  Respiratory distress and hypoxia.  EXAM: CT ANGIOGRAPHY CHEST WITH CONTRAST  TECHNIQUE: Multidetector CT imaging of the chest was performed using the standard protocol during bolus administration of intravenous contrast. Multiplanar CT image reconstructions and MIPs were obtained to evaluate the vascular anatomy.  CONTRAST:  42mL OMNIPAQUE IOHEXOL 350 MG/ML SOLN  COMPARISON:  None.  FINDINGS: THORACIC INLET/BODY WALL:  Dialysis catheter from the right, with tip at the upper right atrium.  There is scarring in keeping with a previous tracheostomy. There is narrowing of the trachea at the thoracic inlet to 9 x 4 mm. No infiltrating mass is seen in this region.  MEDIASTINUM:  Cardiomegaly, with notable enlargement of the left heart. There is diffuse atherosclerosis, including the coronary arteries. The patient is status post CABG. Contrast timing results in limited opacification of the aorta and saphenous grafts. No evidence for aortic dissection or aneurysm. There is no  evidence for pulmonary embolism. No adenopathy.  LUNG WINDOWS:  Small, predominantly layering bilateral pleural effusions with passive atelectasis. There is mild interlobular septal thickening at the bases. Patchy opacity in the upper lobes which roughly conform to secondary pulmonary lobules. These are fairly symmetric and distribution.  UPPER ABDOMEN:  No acute findings.  OSSEOUS:  No acute fracture.  No suspicious lytic or blastic lesions.  Review of the MIP images confirms the above findings.  IMPRESSION: 1. Negative for pulmonary embolism. 2. High-grade tracheal stenosis at the thoracic inlet, likely scarring from remote tracheostomy tube. Minimal diameter measures 9 x 4 mm. 3. Patchy bilateral airspace disease favors pulmonary edema over atypical infection. There are small bilateral pleural effusions.   Electronically Signed   By: Jorje Guild M.D.   On: 06/23/2014  03:42   Dg Chest Portable 1 View  06/23/2014   CLINICAL DATA:  Respiratory distress.  Initial encounter.  EXAM: PORTABLE CHEST - 1 VIEW  COMPARISON:  02/16/2014  FINDINGS: Grossly unchanged enlarged cardiac silhouette and mediastinal contours. Post median sternotomy and CABG. Interval placement of a right subclavian vein approach dialysis catheter with tip projected over the superior cavoatrial junction. The pulmonary vasculature is indistinct with cephalization of flow. Interval development small bilateral effusions and associated bibasilar heterogeneous/consolidative opacities, left greater than right. No pneumothorax. Unchanged bones.  IMPRESSION: Findings worrisome for pulmonary edema with small bilateral effusions and associated bibasilar opacities, left greater than right, atelectasis versus infiltrate.   Electronically Signed   By: Sandi Mariscal M.D.   On: 06/23/2014 00:44    EKG: Independently reviewed.  normal sinus rhythm.  Assessment/Plan Active Problems:   CHF exacerbation   CAD in native artery   Anemia   Hypothyroidism    Renal failure   CHF (congestive heart failure)   1. Shortness of breath most likely from CHF - EF not known. We need to get records from [facility] as patient was just recently discharged. Patient wants to stay at [facility] at this time. Patient was given Lasix 40 mg IV in the ER which we will continue for now daily. Closely follow metabolic panel daily weight intake output. Patient blood pressure is below normal. Since patient also had mild fever and CT scan was showing possible atypical infection patient empiric antibiotics. Check procalcitonin levels. 2. Renal failure with recently being on dialysis - closely follow metabolic panel since patient has received Lasix and also had CAT scan done with contrast. Patient was recently on dialysis. Patient's dialysis catheter is to present in the chest area. 3. History of recent admission at [facility] complicated with ventilator dependent respiratory failure requiring tracheostomy - CAT scan that shows high-grade tracheal stenosis at the thoracic inlet. Closely watch respiratory status. 4. CAD status post CABG - see history of present illness for details. Patient denies any chest pain. Continue aspirin and beta blockers. 5. History of right upper extremity DVT - Coumadin per pharmacy. 6. COPD - presently not wheezing. 7. Anemia probably from chronic disease - baseline hemoglobin not known. Closely follow CBGs. 8. History of anxiety - continue present medications. 9. Hypothyroidism - continue present medications.    Code Status:  full code.  Family Communication:  none.  Disposition Plan:  admit to inpatient.    Vaunda Gutterman N. [facility] Pager [phone].  If 7PM-7AM, please contact night-coverage [email or web address] Password [email or web address] 06/23/2014, 5:56 AM

## 2014-06-23 NOTE — Progress Notes (Signed)
ANTICOAGULATION and ANTIBIOTIC CONSULT NOTE - Initial Consult  Pharmacy Consult for Warfarin  Indication: hx RUE DVT  Pharmacy Consult for Vancomycin/Cefepime  Indication: r/o HCAP  No Known Allergies  Patient Measurements: ~100 kg  Vital Signs: Temp: 99.9 F (37.7 C) (10/15 2359) Temp Source: Oral (10/15 2359) BP: 98/64 mmHg (10/16 0615) Pulse Rate: 79 (10/16 0615)  Labs:  Recent Labs  06/23/14 0011 06/23/14 0129  HGB 9.4*  --   HCT 29.5*  --   PLT 415*  --   LABPROT  --  22.6*  INR  --  1.97*  CREATININE 1.58*  --   TROPONINI <0.30  --     CrCl is unknown because there is no height on file for the current visit.   Medical History: Past Medical History  Diagnosis Date  . COPD (chronic obstructive pulmonary disease)   . MI (myocardial infarction)   . CHF (congestive heart failure)   . Hx of CABG   . Renal disorder   . Thyroid disease   . Hypothyroid     Assessment: 58 y/o F with recent admission at another hospital where she had a complicated stay including CABG, trach, dialysis, DVT. Mild leukocytosis, Scr is elevated at 1.58, CT with patchy airspace disease, INR is 1.97 (warfarin PTA dose: 4mg /day), Hgb 9.4, other labs as above.   Goal of Therapy:  Vancomycin trough 15-20 mg/L INR 2-3 Monitor platelets by anticoagulation protocol: Yes   Plan:  -Warfarin 6 mg x 1 tonight at 1800 -Daily PT/INR -Monitor for bleeding  -Vancomycin 1500 mg IV x 1 in the ED, then 1250 mg IV q24h -Cefepime 2g IV q24h -Trend WBC, temp, renal function  -Drug levels as indicated   Laura Lane 06/23/2014,7:26 AM

## 2014-06-24 LAB — BASIC METABOLIC PANEL
Anion gap: 14 (ref 5–15)
BUN: 24 mg/dL — ABNORMAL HIGH (ref 6–23)
CO2: 24 meq/L (ref 19–32)
CREATININE: 1.77 mg/dL — AB (ref 0.50–1.10)
Calcium: 9 mg/dL (ref 8.4–10.5)
Chloride: 102 mEq/L (ref 96–112)
GFR calc Af Amer: 36 mL/min — ABNORMAL LOW (ref >90)
GFR calc non Af Amer: 31 mL/min — ABNORMAL LOW (ref >90)
GLUCOSE: 142 mg/dL — AB (ref 70–99)
Potassium: 4 mEq/L (ref 3.7–5.3)
Sodium: 140 mEq/L (ref 137–147)

## 2014-06-24 LAB — CBC
HEMATOCRIT: 26.7 % — AB (ref 36.0–46.0)
Hemoglobin: 8.5 g/dL — ABNORMAL LOW (ref 12.0–15.0)
MCH: 31.3 pg (ref 26.0–34.0)
MCHC: 31.8 g/dL (ref 30.0–36.0)
MCV: 98.2 fL (ref 78.0–100.0)
Platelets: 365 10*3/uL (ref 150–400)
RBC: 2.72 MIL/uL — ABNORMAL LOW (ref 3.87–5.11)
RDW: 15.6 % — AB (ref 11.5–15.5)
WBC: 10.4 10*3/uL (ref 4.0–10.5)

## 2014-06-24 LAB — PROTIME-INR
INR: 2.35 — ABNORMAL HIGH (ref 0.00–1.49)
Prothrombin Time: 25.9 seconds — ABNORMAL HIGH (ref 11.6–15.2)

## 2014-06-24 MED ORDER — SODIUM CHLORIDE 0.9 % IV SOLN
INTRAVENOUS | Status: DC
  Administered 2014-06-24: 06:00:00 via INTRAVENOUS

## 2014-06-24 MED ORDER — METOPROLOL TARTRATE 12.5 MG HALF TABLET
12.5000 mg | ORAL_TABLET | Freq: Two times a day (BID) | ORAL | Status: DC
  Administered 2014-06-25 – 2014-06-29 (×9): 12.5 mg via ORAL
  Filled 2014-06-24 (×11): qty 1

## 2014-06-24 MED ORDER — WARFARIN SODIUM 4 MG PO TABS
4.0000 mg | ORAL_TABLET | Freq: Once | ORAL | Status: AC
  Administered 2014-06-24: 4 mg via ORAL
  Filled 2014-06-24 (×2): qty 1

## 2014-06-24 NOTE — Progress Notes (Signed)
ANTICOAGULATION CONSULT NOTE - Follow Up Consult  Pharmacy Consult for Warfarin Indication: RUE DVT  No Known Allergies  Patient Measurements: Height: 4\' 11"  (149.9 cm) Weight: 233 lb 4 oz (105.8 kg) IBW/kg (Calculated) : 43.2 Heparin Dosing Weight: n/a  Vital Signs: Temp: 97.6 F (36.4 C) (10/17 0739) Temp Source: Oral (10/17 0739) BP: 101/45 mmHg (10/17 0739) Pulse Rate: 78 (10/17 0739)  Labs:  Recent Labs  06/23/14 0011 06/23/14 0129 06/23/14 0750 06/23/14 1551 06/23/14 2105 06/24/14 0255  HGB 9.4*  --   --  8.8*  --  8.5*  HCT 29.5*  --   --  27.8*  --  26.7*  PLT 415*  --   --  378  --  365  LABPROT  --  22.6*  --   --   --  25.9*  INR  --  1.97*  --   --   --  2.35*  CREATININE 1.58*  --   --  1.68*  --  1.77*  TROPONINI <0.30  --  <0.30 <0.30 <0.30  --     Estimated Creatinine Clearance: 37.8 ml/min (by C-G formula based on Cr of 1.77).   Medications:  Scheduled:  . ALPRAZolam  1 mg Oral BID  . aspirin EC  81 mg Oral Daily  . ceFEPime (MAXIPIME) IV  2 g Intravenous Q24H  . furosemide  80 mg Intravenous Q12H  . levothyroxine  125 mcg Oral QAC breakfast  . metoprolol tartrate  12.5 mg Oral BID  . pantoprazole  40 mg Oral Daily  . sodium chloride  3 mL Intravenous Q12H  . vancomycin  1,250 mg Intravenous Q24H  . Warfarin - Pharmacist Dosing Inpatient   Does not apply q1800    Assessment: 58 yo female on chronic warfarin for RUE DVT.  Pharmacy asked to dose while inpatient.  Today's INR is therapeutic at 2.35.  No bleeding or complications noted per chart notes.  PTA Coumadin dose is 4 mg daily.  Goal of Therapy:  INR 2-3 Monitor platelets by anticoagulation protocol: Yes   Plan:  1. Coumadin 4 mg po x 1 tonight. 2. Daily PT/INR.  Uvaldo Rising, BCPS  Clinical Pharmacist Pager [phone]  06/24/2014 8:14 AM

## 2014-06-24 NOTE — Progress Notes (Signed)
Pt arrived to floor from [place] at 1400.  Oriented to room .  Place call bell at reach.  Verbalized understanding.  Denies pain/discomfort.  Instructed to call for assist.  Verbalized understanding.  Will continue to monitor.  Karie Kirks, Therapist, sports.

## 2014-06-24 NOTE — Progress Notes (Addendum)
[name]  Kirstie Larsen YFV:[id number] DOB: 27-May-1956 DOA: 06/22/2014 PCP: No primary provider on file.  Brief narrative: 58 year old female patient with significant history of prolonged hospitalization at [facility] beginning in June of 2015. The admit started with an acute MI. Patient had a cardiac catheterization which was complicated by a coronary artery dissection prompting emergency bypass procedure. Patient had prolonged mechanical ventilation that required tracheostomy. She also had acute renal failure requiring dialysis. In addition she was found to have a right upper extremity DVT and remains on Coumadin for this. She was discharged one week prior to this admit from a rehabilitation facility and sent home. On the night of admission patient developed shortness of breath. Apparently this had been subtly present at time of discharge but had worsened on the night of admission. Because of this she came to the emergency department.  In the ER patient was hypoxemic on room air. CT of the chest was concerning for CHF versus atypical infection. She was given a dose of IV Lasix in the ER. She was also mildly febrile so blood cultures were obtained and giving her respiratory findings there was concern she may have pneumonia so she was started on empiric antibiotics. Patient states her last dialysis was prior to discharge - she still has a temporary dialysis catheter in place in the upper chest wall. The ER physician documented that the patient's primary care physician has advised her to start Lasix but she had not done that yet.  In addition to the findings concerning for possible CHF versus atypical infection the CT also revealed a degree of tracheal stenosis at the thoracic inlet minimal - diameter measures 9 x 4 mm.  HPI/Subjective: Pt is feeling much better, and actually asks to be d/c home.  Denies cp, n/v, abdom pain, or sob while at rest.     Assessment/Plan:  Acute respiratory failure with hypoxia due to:   A) Pulmonary edema   B) ? HCAP - doubt  -TTE 03/14/14 EF 50-55% with lateral wall hypokinesis and no apparent diastolic dysfunction - on exam patient appears volume overloaded with dependent edema more marked in the lower extremities - patient hypoalbuminemic and required dialysis during her last hospitalization - continue Lasix - current BP soft in 90s -weight on 06/17/14 93.8 kg - wgt today 106kg - cont to push diuresis but must watch crt closely  -no clinical s/sx to convince me she has PNA - stop abx (especially vanc) and follow   Mitral regurgitation (moderate) Noted on the echocardiogram from 7/7 - this could potentially be contributing to her respiratory symptoms as well - follow  Anemia Baseline hemoglobin appears to be between 8.9 and 9.8 - no evidence of blood loss - follow trend   Chronic kidney disease stage III Baseline creatinine 1.7 on 10/10 - crt has climbed since admit - stop Vanc - cont diuresis - follow trend - keep HD cath in place for now, but if crt stabilizes consider removing prior to d/c home   Multiple Decubitus skin ulcers Patient with multiple decubitus ulcers as follows: 2 on the right lower medial calf, one on the posterior scalp, a large/deep horizontal ulcer stage 3-4 on the low back, and a small sacral ulcer - wound care RN following   History of RUE DVT  Diagnosed by venous duplex 06/06/14 - cont anticoag  Tracheal stenosis  edema could be flash edema as well related to mechanical difficulty with inspiratory  effort, though I doubt this - chart from for [facility] reviewed and while trach in place patient did have bleeding from the trach and underwent bronchoscopy at that time with no significant findings or masses noted  Chronic anticoagulation Coumadin per pharmacy - INR at goal today   History of MI /S/P CABG Symptoms do not appear to be ischemic in nature - enzymes negative  x3  Hypothyroidism TSH pending - continue  Morbid obesity - Body mass index is 47.08 kg/(m^2).  Hypoalbuminemia / protein calorie malnutrition Nutrition consultation  DVT prophylaxis: Warfarin Code Status: Full Family Communication: No family at bedside Disposition Plan/Expected LOS: stable for transfer to tele bed - PT/OT/ambulate   Consultants: None  Procedures: None  Antibiotics: Zosyn 10/15 Vancomycin 10/15 > 10/16 Maxipime 10/16   Objective: Blood pressure 101/45, pulse 78, temperature 97.6 F (36.4 C), temperature source Oral, resp. rate 18, height 4\' 11"  (1.499 m), weight 105.8 kg (233 lb 4 oz), SpO2 97.00%.  Intake/Output Summary (Last 24 hours) at 06/24/14 1035 Last data filed at 06/24/14 0604  Gross per 24 hour  Intake    260 ml  Output   1510 ml  Net  -1250 ml   Exam: General: No acute respiratory distress while at rest in bed Lungs: mild bibasilar crackles - no wheeze - breath sounds distant  Cardiovascular: heart sounds distant - no appreciable M, G, R - RRR Abdomen: Nontender, obese, soft, bowel sounds positive, no rebound, no ascites, no appreciable mass Extremities: No significant cyanosis, clubbing;  2+ edema bilateral lower extremities to mid thigh    Scheduled Meds:  Scheduled Meds: . ALPRAZolam  1 mg Oral BID  . aspirin EC  81 mg Oral Daily  . furosemide  80 mg Intravenous Q12H  . levothyroxine  125 mcg Oral QAC breakfast  . metoprolol tartrate  12.5 mg Oral BID  . pantoprazole  40 mg Oral Daily  . sodium chloride  3 mL Intravenous Q12H  . warfarin  4 mg Oral ONCE-1800  . Warfarin - Pharmacist Dosing Inpatient   Does not apply q1800   Data Reviewed: Basic Metabolic Panel:  Recent Labs Lab 06/23/14 0011 06/23/14 1551 06/24/14 0255  NA 140 141 140  K 4.1 4.7 4.0  CL 103 102 102  CO2 24 26 24   GLUCOSE 156* 179* 142*  BUN 17 21 24*  CREATININE 1.58* 1.68* 1.77*  CALCIUM 8.7 9.1 9.0   Liver Function Tests:  Recent Labs Lab  06/23/14 0011 06/23/14 1551  AST 16 13  ALT 12 11  ALKPHOS 59 55  BILITOT 0.3 0.3  PROT 6.4 6.3  ALBUMIN 2.8* 2.8*   CBC:  Recent Labs Lab 06/23/14 0011 06/23/14 1551 06/24/14 0255  WBC 11.2* 9.0 10.4  NEUTROABS 9.3* 7.9*  --   HGB 9.4* 8.8* 8.5*  HCT 29.5* 27.8* 26.7*  MCV 97.4 95.9 98.2  PLT 415* 378 365   Cardiac Enzymes:  Recent Labs Lab 06/23/14 0011 06/23/14 0750 06/23/14 1551 06/23/14 2105  TROPONINI <0.30 <0.30 <0.30 <0.30   BNP (last 3 results)  Recent Labs  06/23/14 0011  PROBNP 12034.0*    Recent Results (from the past 240 hour(s))  MRSA PCR SCREENING     Status: None   Collection Time    06/23/14 11:46 AM      Result Value Ref Range Status   MRSA by PCR NEGATIVE  NEGATIVE Final   Comment:            The GeneXpert  MRSA Assay (FDA     approved for NASAL specimens     only), is one component of a     comprehensive MRSA colonization     surveillance program. It is not     intended to diagnose MRSA     infection nor to guide or     monitor treatment for     MRSA infections.     Studies:  Recent x-ray studies have been reviewed in detail by the Attending Physician  Time spent :  40 mins  Cherene Altes, MD [facility] For Consults/Admissions - Flow Manager - [phone] Office  [phone] Pager [phone]  On-Call/Text Page:      [email or web address]      password [facility]   06/24/2014, 10:35 AM   LOS: 2 days

## 2014-06-25 DIAGNOSIS — J810 Acute pulmonary edema: Secondary | ICD-10-CM

## 2014-06-25 LAB — RENAL FUNCTION PANEL
Albumin: 2.7 g/dL — ABNORMAL LOW (ref 3.5–5.2)
Anion gap: 14 (ref 5–15)
BUN: 24 mg/dL — AB (ref 6–23)
CHLORIDE: 101 meq/L (ref 96–112)
CO2: 27 meq/L (ref 19–32)
Calcium: 8.5 mg/dL (ref 8.4–10.5)
Creatinine, Ser: 1.86 mg/dL — ABNORMAL HIGH (ref 0.50–1.10)
GFR calc Af Amer: 34 mL/min — ABNORMAL LOW (ref >90)
GFR, EST NON AFRICAN AMERICAN: 29 mL/min — AB (ref >90)
Glucose, Bld: 99 mg/dL (ref 70–99)
Phosphorus: 5.2 mg/dL — ABNORMAL HIGH (ref 2.3–4.6)
Potassium: 3.6 mEq/L — ABNORMAL LOW (ref 3.7–5.3)
Sodium: 142 mEq/L (ref 137–147)

## 2014-06-25 LAB — CBC
HEMATOCRIT: 26.8 % — AB (ref 36.0–46.0)
HEMOGLOBIN: 8.4 g/dL — AB (ref 12.0–15.0)
MCH: 30.5 pg (ref 26.0–34.0)
MCHC: 31.3 g/dL (ref 30.0–36.0)
MCV: 97.5 fL (ref 78.0–100.0)
Platelets: 382 10*3/uL (ref 150–400)
RBC: 2.75 MIL/uL — ABNORMAL LOW (ref 3.87–5.11)
RDW: 16 % — ABNORMAL HIGH (ref 11.5–15.5)
WBC: 8.8 10*3/uL (ref 4.0–10.5)

## 2014-06-25 LAB — PROTIME-INR
INR: 2.61 — ABNORMAL HIGH (ref 0.00–1.49)
Prothrombin Time: 28.1 seconds — ABNORMAL HIGH (ref 11.6–15.2)

## 2014-06-25 MED ORDER — WARFARIN SODIUM 4 MG PO TABS
4.0000 mg | ORAL_TABLET | Freq: Once | ORAL | Status: AC
  Administered 2014-06-25: 4 mg via ORAL
  Filled 2014-06-25: qty 1

## 2014-06-25 NOTE — Progress Notes (Signed)
Dressing to the back changed.

## 2014-06-25 NOTE — Progress Notes (Addendum)
[facility] PROGRESS NOTE Interim History: 58 year old female patient with significant history of prolonged hospitalization at [facility] beginning in June of 2015. The admit started with an acute MI. Patient had a cardiac catheterization which was complicated by a coronary artery dissection prompting emergency bypass procedure. Patient had prolonged mechanical ventilation that required tracheostomy and acute renal failure requiring dialysis. In addition she was found to have a right upper extremity DVT and remains on Coumadin for this. She was discharged one week prior to this admit from a rehabilitation facility and sent home. On the night of admission patient developed shortness of breath.  In the ER patient was hypoxemic on room air. CT of the chest was concerning for CHF versus atypical infection. She was given a dose of IV Lasix and was started on empiric antibiotics. Patient states her last dialysis was prior to discharge - she still has a temporary dialysis catheter in place in the upper chest wall. Darlin Priestly Weights   06/24/14 0400 06/24/14 1509 06/25/14 0631  Weight: 105.8 kg (233 lb 4 oz) 102.649 kg (226 lb 4.8 oz) 104.781 kg (231 lb)        Intake/Output Summary (Last 24 hours) at 06/25/14 0918 Last data filed at 06/25/14 0160  Gross per 24 hour  Intake    240 ml  Output   2750 ml  Net  -2510 ml     Assessment/Plan: Acute respiratory failure with hypoxia due to Acute systolic heart failure/Pulmonary edema/ - TEE 6.19.2015 EF 40-45%% with lateral wall hypokinesis and no apparent diastolic dysfunction  - On exam she is still volume overloaded, ? If hypoalbuminemia contributing to edema. - Weight on 06/17/14 93.8 kg - wgt today 106kg. - monitor electrolytes. - PT eval pending  Mitral regurgitation (moderate): - Noted on the TEE from 6.19.2015  Anemia  - Baseline hemoglobin appears to be between 8.9 and 9.8   Chronic kidney disease stage III  - Baseline  creatinine 1.7  - cont to monitor.  Multiple Decubitus skin ulcers  - Patient with multiple decubitus ulcers as follows: 2 on the right lower medial calf, one on the posterior scalp, a large/deep horizontal ulcer stage 3-4 on the low back, and a small sacral ulcer - wound care RN following   History of RUE DVT  Diagnosed by venous duplex 06/06/14 - cont anticoag   Tracheal stenosis   - chart from for [facility] reviewed and while trach in place patient did have bleeding from the trach and underwent bronchoscopy at that time with no significant findings or masses noted   Chronic anticoagulation: - Coumadin per pharmacy  - INR therapeutic.  History of MI /S/P CABG  Symptoms do not appear to be ischemic in nature - enzymes negative x3   Hypothyroidism  TSH 6.8, cont synthroid.  Morbid obesity - Body mass index is 47.08 kg/(m^2).   Hypoalbuminemia / protein calorie malnutrition    Code Status: full code.  Family Communication: none.  Disposition Plan: admit to inpatient.     Consultants:  none  Procedures: ECHO: none  Antibiotics:  none   HPI/Subjective: No complains cont to improve  Objective: Filed Vitals:   06/24/14 2330 06/25/14 0027 06/25/14 0235 06/25/14 0631  BP: 102/60 110/60 110/78 99/60  Pulse: 93 103 83 80  Temp:   97.5 F (36.4 C) 97.3 F (36.3 C)  TempSrc:   Oral Oral  Resp:   18 18  Height:      Weight:  104.781 kg (231 lb)  SpO2:   100% 100%    Exam:  General: Alert, awake, oriented x3, in no acute distress.  HEENT: No bruits, no goiter.  Heart: Regular rate and rhythm 3+ edema. Lungs: Good air movement, clear Abdomen: Soft, nontender, nondistended, positive bowel sounds.     Data Reviewed: Basic Metabolic Panel:  Recent Labs Lab 06/23/14 0011 06/23/14 1551 06/24/14 0255 06/25/14 0400  NA 140 141 140 142  K 4.1 4.7 4.0 3.6*  CL 103 102 102 101  CO2 24 26 24 27   GLUCOSE 156* 179* 142* 99  BUN 17 21 24* 24*    CREATININE 1.58* 1.68* 1.77* 1.86*  CALCIUM 8.7 9.1 9.0 8.5  PHOS  --   --   --  5.2*   Liver Function Tests:  Recent Labs Lab 06/23/14 0011 06/23/14 1551 06/25/14 0400  AST 16 13  --   ALT 12 11  --   ALKPHOS 59 55  --   BILITOT 0.3 0.3  --   PROT 6.4 6.3  --   ALBUMIN 2.8* 2.8* 2.7*   No results found for this basename: LIPASE, AMYLASE,  in the last 168 hours No results found for this basename: AMMONIA,  in the last 168 hours CBC:  Recent Labs Lab 06/23/14 0011 06/23/14 1551 06/24/14 0255 06/25/14 0400  WBC 11.2* 9.0 10.4 8.8  NEUTROABS 9.3* 7.9*  --   --   HGB 9.4* 8.8* 8.5* 8.4*  HCT 29.5* 27.8* 26.7* 26.8*  MCV 97.4 95.9 98.2 97.5  PLT 415* 378 365 382   Cardiac Enzymes:  Recent Labs Lab 06/23/14 0011 06/23/14 0750 06/23/14 1551 06/23/14 2105  TROPONINI <0.30 <0.30 <0.30 <0.30   BNP (last 3 results)  Recent Labs  06/23/14 0011  PROBNP 12034.0*   CBG: No results found for this basename: GLUCAP,  in the last 168 hours  Recent Results (from the past 240 hour(s))  CULTURE, BLOOD (ROUTINE X 2)     Status: None   Collection Time    06/23/14  5:06 AM      Result Value Ref Range Status   Specimen Description BLOOD HAND LEFT   Final   Special Requests BOTTLES DRAWN AEROBIC AND ANAEROBIC 10CC   Final   Culture  Setup Time     Final   Value: 06/23/2014 08:46     Performed at [facility]   Culture     Final   Value:        BLOOD CULTURE RECEIVED NO GROWTH TO DATE CULTURE WILL BE HELD FOR 5 DAYS BEFORE ISSUING A FINAL NEGATIVE REPORT     Performed at [facility]   Report Status PENDING   Incomplete  CULTURE, BLOOD (ROUTINE X 2)     Status: None   Collection Time    06/23/14  7:30 AM      Result Value Ref Range Status   Specimen Description BLOOD HAND LEFT   Final   Special Requests BOTTLES DRAWN AEROBIC AND ANAEROBIC 5ML   Final   Culture  Setup Time     Final   Value: 06/23/2014 17:08     Performed at [facility]    Culture     Final   Value:        BLOOD CULTURE RECEIVED NO GROWTH TO DATE CULTURE WILL BE HELD FOR 5 DAYS BEFORE ISSUING A FINAL NEGATIVE REPORT     Performed at [facility]   Report Status  PENDING   Incomplete  MRSA PCR SCREENING     Status: None   Collection Time    06/23/14 11:46 AM      Result Value Ref Range Status   MRSA by PCR NEGATIVE  NEGATIVE Final   Comment:            The GeneXpert MRSA Assay (FDA     approved for NASAL specimens     only), is one component of a     comprehensive MRSA colonization     surveillance program. It is not     intended to diagnose MRSA     infection nor to guide or     monitor treatment for     MRSA infections.     Studies: No results found.  Scheduled Meds: . ALPRAZolam  1 mg Oral BID  . aspirin EC  81 mg Oral Daily  . furosemide  80 mg Intravenous Q12H  . levothyroxine  125 mcg Oral QAC breakfast  . metoprolol tartrate  12.5 mg Oral BID  . pantoprazole  40 mg Oral Daily  . sodium chloride  3 mL Intravenous Q12H  . Warfarin - Pharmacist Dosing Inpatient   Does not apply q1800   Continuous Infusions:    Charlynne Cousins  [facility] Pager [phone] If 8PM-8AM, please contact night-coverage at [email or web address], password [facility] 06/25/2014, 9:18 AM  LOS: 3 days

## 2014-06-25 NOTE — Progress Notes (Signed)
ANTICOAGULATION CONSULT NOTE - Follow Up Consult  Pharmacy Consult for Coumadin Indication: DVT  No Known Allergies  Patient Measurements: Height: 4\' 11"  (149.9 cm) Weight: 231 lb (104.781 kg) (BEDSCALE due to patient want to sleep) IBW/kg (Calculated) : 43.2 Heparin Dosing Weight:   Vital Signs: Temp: 97.3 F (36.3 C) (10/18 0631) Temp Source: Oral (10/18 0631) BP: 99/60 mmHg (10/18 0631) Pulse Rate: 80 (10/18 0631)  Labs:  Recent Labs  06/23/14 0129 06/23/14 0750 06/23/14 1551 06/23/14 2105 06/24/14 0255 06/25/14 0400  HGB  --   --  8.8*  --  8.5* 8.4*  HCT  --   --  27.8*  --  26.7* 26.8*  PLT  --   --  378  --  365 382  LABPROT 22.6*  --   --   --  25.9* 28.1*  INR 1.97*  --   --   --  2.35* 2.61*  CREATININE  --   --  1.68*  --  1.77* 1.86*  TROPONINI  --  <0.30 <0.30 <0.30  --   --     Estimated Creatinine Clearance: 35.7 ml/min (by C-G formula based on Cr of 1.86).   Medications:  Scheduled:  . ALPRAZolam  1 mg Oral BID  . aspirin EC  81 mg Oral Daily  . furosemide  80 mg Intravenous Q12H  . levothyroxine  125 mcg Oral QAC breakfast  . metoprolol tartrate  12.5 mg Oral BID  . pantoprazole  40 mg Oral Daily  . sodium chloride  3 mL Intravenous Q12H  . Warfarin - Pharmacist Dosing Inpatient   Does not apply q1800    Assessment: 58yo female with RUE DVT.  INR 2.61 with stable Hg at 8.4 and pltc wnl.  No bleeding noted.  Goal of Therapy:  INR 2-3 Monitor platelets by anticoagulation protocol: Yes   Plan:  1-  Coumadin 4mg  today 2-  F/U in AM  Gracy Bruins, PharmD [facility]

## 2014-06-25 NOTE — Evaluation (Addendum)
Physical Therapy Evaluation Patient Details Name: Laura Lane MRN: [id number] DOB: 10/06/1955 Today's Date: 06/25/2014   History of Present Illness  Pt. admitted 06/22/14 with SOB, hypoxemia, acute respiratory failure due to systolic HF/pulmonary edema.  Pt. has lengthy   history of prolonged hospitalization at [facility] beginning in June of 2015. The admit started with an acute MI. Patient had a cardiac catheterization which was complicated by a coronary artery dissection prompting emergency bypass procedure. Patient had prolonged mechanical ventilation that required tracheostomy and acute renal failure requiring dialysis. In addition she was found to have a right upper extremity DVT . She was discharged one week prior to this admit from [facility] facility and sent home. On the night of admission patient developed shortness of breath.    CT of the chest was concerning for CHF versus atypical infection.  Patient states her last dialysis was prior to discharge - she still has a temporary dialysis catheter in place in the upper chest wall. T   Clinical Impression  Pt. Presents to PT with a decline in her functional level from time of DC from [facility] and will benefit from acute PT to address these functional mobility issues and the below problem areas.  She did desaturate on room air while walking.  See pertinent vitals below for findings.  She states she will have 24 hour care at time of DC home and will likely need HHPT f/u.  Husband at the bedside and very involved in pt's care.  Educated pt. And her spouse on best ways for him to assist her in her mobility tasks.    Follow Up Recommendations Home health PT;Supervision/Assistance - 24 hour;Supervision for mobility/OOB    Equipment Recommendations  None recommended by PT    Recommendations for Other Services       Precautions / Restrictions Precautions Precautions: Fall;Other (comment) (decubitus  ulcers) Restrictions Weight Bearing Restrictions: No      Mobility  Bed Mobility Overal bed mobility: Needs Assistance Bed Mobility: Supine to Sit;Sit to Supine     Supine to sit: Min assist Sit to supine: Min assist   General bed mobility comments: min assist needed to manage LEs  Transfers Overall transfer level: Needs assistance Equipment used: Rolling walker (2 wheeled) Transfers: Sit to/from Stand Sit to Stand: Min assist         General transfer comment: min assist for safety and to power up  Ambulation/Gait Ambulation/Gait assistance: Min assist Ambulation Distance (Feet): 30 Feet Assistive device: Rolling walker (2 wheeled) Gait Pattern/deviations: Step-through pattern;Decreased step length - right;Decreased step length - left Gait velocity: decreased   General Gait Details: Pt. needed min assist for steadying; slow pace   Stairs            Wheelchair Mobility    Modified Rankin (Stroke Patients Only)       Balance Overall balance assessment: Needs assistance Sitting-balance support: No upper extremity supported Sitting balance-Leahy Scale: Good     Standing balance support: Bilateral upper extremity supported Standing balance-Leahy Scale: Poor Standing balance comment: needed support of RW for standing at bedside                             Pertinent Vitals/Pain Pain Assessment: No/denies pain O2 at rest on 93% on room air (pt. Had removed O2 to eat and had not replaced it) O2 with activity on RA 78%  O2 at end of activity on 2L  O2 = 96%    Home Living Family/patient expects to be discharged to:: Private residence Living Arrangements: Spouse/significant other;Children;Non-relatives/Friends (grandbaby) Available Help at Discharge: Family;Available 24 hours/day Type of Home: House Home Access: Ramped entrance     Home Layout: One level Home Equipment: Wheelchair - manual;Bedside commode;Walker - 2 wheels;Tub bench (friend  to loan her a tub bench)      Prior Function Level of Independence: Independent with assistive device(s)               Hand Dominance        Extremity/Trunk Assessment   Upper Extremity Assessment: Generalized weakness           Lower Extremity Assessment: Generalized weakness      Cervical / Trunk Assessment: Normal  Communication   Communication: No difficulties  Cognition Arousal/Alertness: Awake/alert Behavior During Therapy: WFL for tasks assessed/performed Overall Cognitive Status: Within Functional Limits for tasks assessed                      General Comments General comments (skin integrity, edema, etc.): Pt. with multiple healing decubitus ulcers including sacarum, posterior back, posterior head (blistered) and right anterior lower leg    Exercises        Assessment/Plan    PT Assessment Patient needs continued PT services  PT Diagnosis Difficulty walking;Generalized weakness   PT Problem List Decreased strength;Decreased activity tolerance;Decreased balance;Decreased mobility;Decreased knowledge of use of DME;Decreased skin integrity;Obesity  PT Treatment Interventions DME instruction;Gait training;Functional mobility training;Therapeutic activities;Therapeutic exercise;Balance training;Patient/family education   PT Goals (Current goals can be found in the Care Plan section) Acute Rehab PT Goals Patient Stated Goal: home with home health and family PT Goal Formulation: With patient Time For Goal Achievement: 07/09/14 Potential to Achieve Goals: Good    Frequency Min 3X/week   Barriers to discharge        Co-evaluation               End of Session Equipment Utilized During Treatment: Gait belt Activity Tolerance: Patient tolerated treatment well Patient left: in bed;with call bell/phone within reach;with family/visitor present (husband present) Nurse Communication: Mobility status         Time: 4982-6415 PT Time  Calculation (min): 39 min   Charges:   PT Evaluation $Initial PT Evaluation Tier I: 1 Procedure PT Treatments $Gait Training: 8-22 mins $Self Care/Home Management: 8-22   PT G Codes:          Ladona Ridgel 06/25/2014, 4:56 PM Gerlean Ren PT Acute Rehab Services [place] [phone]

## 2014-06-26 DIAGNOSIS — Z86718 Personal history of other venous thrombosis and embolism: Secondary | ICD-10-CM

## 2014-06-26 LAB — PROTIME-INR
INR: 2.23 — AB (ref 0.00–1.49)
PROTHROMBIN TIME: 24.9 s — AB (ref 11.6–15.2)

## 2014-06-26 MED ORDER — WARFARIN SODIUM 4 MG PO TABS
4.0000 mg | ORAL_TABLET | Freq: Every day | ORAL | Status: DC
  Administered 2014-06-26 – 2014-06-27 (×2): 4 mg via ORAL
  Filled 2014-06-26 (×4): qty 1

## 2014-06-26 MED ORDER — CLOTRIMAZOLE 1 % VA CREA
1.0000 | TOPICAL_CREAM | Freq: Every day | VAGINAL | Status: DC
  Administered 2014-06-27 – 2014-06-28 (×3): 1 via VAGINAL
  Filled 2014-06-26 (×2): qty 45

## 2014-06-26 NOTE — Progress Notes (Signed)
[facility] PROGRESS NOTE Interim History: 58 year old female patient with significant history of prolonged hospitalization at [facility] beginning in June of 2015. The admit started with an acute MI. Patient had a cardiac catheterization which was complicated by a coronary artery dissection prompting emergency bypass procedure. Patient had prolonged mechanical ventilation that required tracheostomy and acute renal failure requiring dialysis. In addition she was found to have a right upper extremity DVT and remains on Coumadin for this. She was discharged one week prior to this admit from a rehabilitation facility and sent home. On the night of admission patient developed shortness of breath.  In the ER patient was hypoxemic on room air. CT of the chest was concerning for CHF versus atypical infection. She was given a dose of IV Lasix and was started on empiric antibiotics. Patient states her last dialysis was prior to discharge - she still has a temporary dialysis catheter in place in the upper chest wall. Darlin Priestly Weights   06/24/14 1509 06/25/14 0631 06/26/14 0637  Weight: 102.649 kg (226 lb 4.8 oz) 104.781 kg (231 lb) 104.055 kg (229 lb 6.4 oz)        Intake/Output Summary (Last 24 hours) at 06/26/14 1040 Last data filed at 06/26/14 7591  Gross per 24 hour  Intake    583 ml  Output   3175 ml  Net  -2592 ml     Assessment/Plan: Acute respiratory failure with hypoxia due to Acute systolic heart failure/Pulmonary edema/ - TEE 6.19.2015 EF 40-45%% with lateral wall hypokinesis and no apparent diastolic dysfunction  - On exam she is still volume overloaded, hypoalbuminemia contributing to edema. Today mildly hypotensive. Will hold lasix. - Weight on 06/17/14 93.8 kg - wgt today 104 kg - monitor electrolytes. Check a b-met i am. - PT eval rec home with PT.  Mitral regurgitation (moderate): - Noted on the TEE from 6.19.2015  Anemia  - Baseline hemoglobin appears to be  between 8.9 and 9.8   Chronic kidney disease stage III  - Baseline creatinine 1.7  - cont to monitor.  Multiple Decubitus skin ulcers  - Patient with multiple decubitus ulcers as follows: 2 on the right lower medial calf, one on the posterior scalp, a large/deep horizontal ulcer stage 3-4 on the low back, and a small sacral ulcer - wound care RN following   History of RUE DVT  Diagnosed by venous duplex 06/06/14 - cont anticoag   Tracheal stenosis   - chart from for [facility] reviewed and while trach in place patient did have bleeding from the trach and underwent bronchoscopy at that time with no significant findings or masses noted   Chronic anticoagulation: - Coumadin per pharmacy  - INR therapeutic.  History of MI /S/P CABG  Symptoms do not appear to be ischemic in nature - enzymes negative x3   Hypothyroidism  TSH 6.8, cont synthroid.  Morbid obesity - Body mass index is 47.08 kg/(m^2).   Hypoalbuminemia / protein calorie malnutrition    Code Status: full code.  Family Communication: none.  Disposition Plan: admit to inpatient.     Consultants:  none  Procedures: ECHO: none  Antibiotics:  none   HPI/Subjective: No complains.  Objective: Filed Vitals:   06/25/14 2214 06/26/14 0637 06/26/14 0924 06/26/14 0934  BP: 109/67 1_0  Pulse: 86 81    Temp: 98.3 F (36.8 C) 98 F (36.7 C)    TempSrc: Oral Oral    Resp: 20 20 18  Height:      Weight:  104.055 kg (229 lb 6.4 oz)    SpO2: 98% 96% 97%     Exam:  General: Alert, awake, oriented x3, in no acute distress.  HEENT: No bruits, no goiter.  Heart: Regular rate and rhythm Anasarca. Lungs: Good air movement, clear Abdomen: Soft, nontender, nondistended, positive bowel sounds.     Data Reviewed: Basic Metabolic Panel:  Recent Labs Lab 06/23/14 0011 06/23/14 1551 06/24/14 0255 06/25/14 0400  NA 140 141 140 142  K 4.1 4.7 4.0 3.6*  CL 103 102 102 101  CO2 _0 GLUCOSE 156* 179* 142* 99  BUN 17 21 24* 24*  CREATININE 1.58* 1.68* 1.77* 1.86*  CALCIUM 8.7 9.1 9.0 8.5  PHOS  --   --   --  5.2*   Liver Function Tests:  Recent Labs Lab 06/23/14 0011 06/23/14 1551 06/25/14 0400  AST 16 13  --   ALT 12 11  --   ALKPHOS 59 55  --   BILITOT 0.3 0.3  --   PROT 6.4 6.3  --   ALBUMIN 2.8* 2.8* 2.7*   No results found for this basename: LIPASE, AMYLASE,  in the last 168 hours No results found for this basename: AMMONIA,  in the last 168 hours CBC:  Recent Labs Lab 06/23/14 0011 06/23/14 1551 06/24/14 0255 06/25/14 0400  WBC 11.2* 9.0 10.4 8.8  NEUTROABS 9.3* 7.9*  --   --   HGB 9.4* 8.8* 8.5* 8.4*  HCT 29.5* 27.8* 26.7* 26.8*  MCV 97.4 95.9 98.2 97.5  PLT 415* 378 365 382   Cardiac Enzymes:  Recent Labs Lab 06/23/14 0011 06/23/14 0750 06/23/14 1551 06/23/14 2105  TROPONINI <0.30 <0.30 <0.30 <0.30   BNP (last 3 results)  Recent Labs  06/23/14 0011  PROBNP 12034.0*   CBG: No results found for this basename: GLUCAP,  in the last 168 hours  Recent Results (from the past 240 hour(s))  CULTURE, BLOOD (ROUTINE X 2)     Status: None   Collection Time    06/23/14  5:06 AM      Result Value Ref Range Status   Specimen Description BLOOD HAND LEFT   Final   Special Requests BOTTLES DRAWN AEROBIC AND ANAEROBIC 10CC   Final   Culture  Setup Time     Final   Value: 06/23/2014 08:46     Performed at [facility]   Culture     Final   Value:        BLOOD CULTURE RECEIVED NO GROWTH TO DATE CULTURE WILL BE HELD FOR 5 DAYS BEFORE ISSUING A FINAL NEGATIVE REPORT     Performed at [facility]   Report Status PENDING   Incomplete  CULTURE, BLOOD (ROUTINE X 2)     Status: None   Collection Time    06/23/14  7:30 AM      Result Value Ref Range Status   Specimen Description BLOOD HAND LEFT   Final   Special Requests BOTTLES DRAWN AEROBIC AND ANAEROBIC 5ML   Final   Culture  Setup Time     Final   Value: 06/23/2014  17:08     Performed at [facility]   Culture     Final   Value:        BLOOD CULTURE RECEIVED NO GROWTH TO DATE CULTURE WILL BE HELD FOR 5 DAYS BEFORE ISSUING A FINAL NEGATIVE REPORT  Performed at [facility]   Report Status PENDING   Incomplete  MRSA PCR SCREENING     Status: None   Collection Time    06/23/14 11:46 AM      Result Value Ref Range Status   MRSA by PCR NEGATIVE  NEGATIVE Final   Comment:            The GeneXpert MRSA Assay (FDA     approved for NASAL specimens     only), is one component of a     comprehensive MRSA colonization     surveillance program. It is not     intended to diagnose MRSA     infection nor to guide or     monitor treatment for     MRSA infections.     Studies: No results found.  Scheduled Meds: . ALPRAZolam  1 mg Oral BID  . aspirin EC  81 mg Oral Daily  . clotrimazole  1 Applicatorful Vaginal [id number]  . furosemide  80 mg Intravenous Q12H  . levothyroxine  125 mcg Oral QAC breakfast  . metoprolol tartrate  12.5 mg Oral BID  . pantoprazole  40 mg Oral Daily  . sodium chloride  3 mL Intravenous Q12H  . Warfarin - Pharmacist Dosing Inpatient   Does not apply q1800   Continuous Infusions:    Charlynne Cousins  [facility] Pager [phone] If 8PM-8AM, please contact night-coverage at [email or web address], password [facility] 06/26/2014, 10:40 AM  LOS: 4 days

## 2014-06-26 NOTE — Progress Notes (Signed)
ANTICOAGULATION CONSULT NOTE - Follow Up Consult  Pharmacy Consult for Coumadin Indication: DVT  No Known Allergies  Patient Measurements: Height: 4\' 11"  (149.9 cm) Weight: 229 lb 6.4 oz (104.055 kg) (bed scale) IBW/kg (Calculated) : 43.2 Heparin Dosing Weight:   Vital Signs: Temp: 98 F (36.7 C) (10/19 0637) Temp Source: Oral (10/19 8338) BP: 82/44 mmHg (10/19 0934) Pulse Rate: 81 (10/19 0637)  Labs:  Recent Labs  06/23/14 1551 06/23/14 2105 06/24/14 0255 06/25/14 0400 06/26/14 0455  HGB 8.8*  --  8.5* 8.4*  --   HCT 27.8*  --  26.7* 26.8*  --   PLT 378  --  365 382  --   LABPROT  --   --  25.9* 28.1* 24.9*  INR  --   --  2.35* 2.61* 2.23*  CREATININE 1.68*  --  1.77* 1.86*  --   TROPONINI <0.30 <0.30  --   --   --     Estimated Creatinine Clearance: 35.6 ml/min (by C-G formula based on Cr of 1.86).  Assessment: 58 y/o F with recent admission at [facility] where she had a complicated stay including MI>>cath with coronary artery dissection>> emergency bypass, VDFR>>trach, AFR>>dialysis, and DVT. Admitted 06/22/2014 to [facility] with SOB. Multiple decub. Ulcers.  PMH: recent CABG (2nd aortic dissection post cath), CAD, HF, hypothryoid, COPD,   AntiCoag: Coum-Rx: recent RUE DVT: INR is 2.23 (warfarin PTA dose: 4mg /day). Hgb only 8.4.  ID: Abx d/c'd 10/17. Afeb, WBC 8.8, CT with patchy airspace disease.  10/16 vanc> 10/17 10/16 cefepime> 10/17 10/16 BCx x 2 > pending  Cardiovascular: Hx MI, CHF, CABG - BP 82/44, HR 81 on ASA81, metoprolol  Endocrinology: Synthroid  Gastrointestinal / Nutritio: po PPI. Hypoalbumin. BMI 47.  Neurology  Nephrology: CKD-3. Scr trending up 1.86, CrCl ~ 35. Required HD at recent admission at [facility], with temp HD cath still in place  Pulmonary: RA  Hematology / Oncology: Hgb 8.4, Pltc wnl (stable)  PTA Medication Issues: resumed appropriately  Best Practices: warfarin, po Ptx   Goal of Therapy:  INR 2-3 Monitor platelets by  anticoagulation protocol: Yes   Plan:  Coumadin 4mg  daily. Daily INR   Liberato Stansbery S. Alford Highland, PharmD, BCPS Clinical Staff Pharmacist Pager [phone]  Eilene Ghazi Stillinger 06/26/2014,12:21 PM

## 2014-06-26 NOTE — Progress Notes (Signed)
Attending MD  this am was paged and  notified of patient's low blood pressures. Blood pressure was taken automatically with Dinamap and also manually. Was told to hold am dose of lasix and metoprolol.

## 2014-06-26 NOTE — Progress Notes (Signed)
Report Given to receiving RN. Patient sitting in recliner resting watching TV. No verbal complaints.

## 2014-06-26 NOTE — Progress Notes (Signed)
Patient is A/Ox4 and is ambulatory from bed to chair with two person assist. She had no c/o pain during the shift. Dressing changes were done. Foley catheter in placed and secured. Foley care  Done.

## 2014-06-27 DIAGNOSIS — Z6841 Body Mass Index (BMI) 40.0 and over, adult: Secondary | ICD-10-CM

## 2014-06-27 LAB — BASIC METABOLIC PANEL
Anion gap: 13 (ref 5–15)
BUN: 20 mg/dL (ref 6–23)
CALCIUM: 8.8 mg/dL (ref 8.4–10.5)
CO2: 29 meq/L (ref 19–32)
CREATININE: 1.43 mg/dL — AB (ref 0.50–1.10)
Chloride: 99 mEq/L (ref 96–112)
GFR calc Af Amer: 46 mL/min — ABNORMAL LOW (ref >90)
GFR calc non Af Amer: 40 mL/min — ABNORMAL LOW (ref >90)
GLUCOSE: 91 mg/dL (ref 70–99)
Potassium: 3.5 mEq/L — ABNORMAL LOW (ref 3.7–5.3)
Sodium: 141 mEq/L (ref 137–147)

## 2014-06-27 LAB — PROTIME-INR
INR: 2.11 — AB (ref 0.00–1.49)
Prothrombin Time: 23.8 seconds — ABNORMAL HIGH (ref 11.6–15.2)

## 2014-06-27 MED ORDER — POTASSIUM CHLORIDE CRYS ER 20 MEQ PO TBCR
40.0000 meq | EXTENDED_RELEASE_TABLET | Freq: Two times a day (BID) | ORAL | Status: AC
  Administered 2014-06-27 (×2): 40 meq via ORAL
  Filled 2014-06-27 (×2): qty 2

## 2014-06-27 MED ORDER — FUROSEMIDE 10 MG/ML IJ SOLN
40.0000 mg | Freq: Two times a day (BID) | INTRAMUSCULAR | Status: DC
  Administered 2014-06-27 – 2014-06-29 (×5): 40 mg via INTRAVENOUS
  Filled 2014-06-27 (×4): qty 4

## 2014-06-27 MED ORDER — POLYETHYLENE GLYCOL 3350 17 G PO PACK
17.0000 g | PACK | Freq: Every day | ORAL | Status: DC | PRN
  Administered 2014-06-29: 17 g via ORAL
  Filled 2014-06-27: qty 1

## 2014-06-27 NOTE — Progress Notes (Signed)
ANTICOAGULATION CONSULT NOTE - Follow Up Consult  Pharmacy Consult for Coumadin Indication: DVT  No Known Allergies  Patient Measurements: Height: 4\' 11"  (149.9 cm) Weight: 220 lb 4.8 oz (99.927 kg) IBW/kg (Calculated) : 43.2 Heparin Dosing Weight:   Vital Signs: Temp: 98.3 F (36.8 C) (10/20 0555) Temp Source: Oral (10/20 0555) BP: 107/73 mmHg (10/20 0555) Pulse Rate: 84 (10/20 0555)  Labs:  Recent Labs  06/25/14 0400 06/26/14 0455 06/27/14 0436  HGB 8.4*  --   --   HCT 26.8*  --   --   PLT 382  --   --   LABPROT 28.1* 24.9* 23.8*  INR 2.61* 2.23* 2.11*  CREATININE 1.86*  --  1.43*    Estimated Creatinine Clearance: 45.2 ml/min (by C-G formula based on Cr of 1.43).  Assessment: 58 y/o F with recent admission at [facility] where she had a complicated stay including MI>>cath with coronary artery dissection>> emergency bypass, VDFR>>trach, AFR>>dialysis, and DVT. Admitted 06/22/2014 to [facility] with SOB. Multiple decub. Ulcers.  PMH: recent CABG (2nd aortic dissection post cath), CAD, HF, hypothryoid, COPD,   AntiCoag: Coum-Rx: recent RUE DVT: INR is 2.11 (warfarin PTA dose: 4mg /day, LD 10/15). Hgb only 8.4.  ID: Abx d/c'd 10/17. Afeb, WBC 8.8, CT with patchy airspace disease.  10/16 vanc> 10/17 10/16 cefepime> 10/17 10/16 BCx x 2 > pending  Cardiovascular: Hx MI, CHF 40-45%, CABG - BP 107/73, HR 84 on ASA81, metoprolol, IV Lasix. Monitor hypotension  Endocrinology: Synthroid  Gastrointestinal / Nutritio: po PPI. Hypoalbumin. BMI 47.  Neurology: Scheduled Xanax  Nephrology: CKD-3. Scr 1.43 down, CrCl ~ 45. Required HD at recent admission at [facility], with temp HD cath still in place  Pulmonary: RA  Hematology / Oncology: Hgb 8.4, Pltc wnl (stable)  PTA Medication Issues: resumed appropriately  Best Practices: warfarin, po Ptx   Goal of Therapy:  INR 2-3 Monitor platelets by anticoagulation protocol: Yes   Plan:  Coumadin 4mg  daily. Daily  INR   Laura Lane, PharmD, BCPS Clinical Staff Pharmacist Pager [phone]  Laura Lane 06/27/2014,10:57 AM

## 2014-06-27 NOTE — Care Management Note (Addendum)
    Page 1 of 2   06/29/2014     4:27:46 PM CARE MANAGEMENT NOTE 06/29/2014  Patient:  Laura Lane, Laura Lane   Account Number:  [id number]  Date Initiated:  06/23/2014  Documentation initiated by:  Elissa Hefty  Subjective/Objective Assessment:   adm w shortness of breath     Action/Plan:   lives w husband; consult for LTAC   Anticipated DC Date:  06/30/2014   Anticipated DC Plan:  LONG TERM ACUTE CARE (LTAC)      [place]  CM consult      [place]  Resumption Of Svcs/PTA Provider   Choice offered to / List presented to:  C-1 Patient   DME arranged  [facility]      DME agency  [place] arranged  HH-1 RN  [facility]      [place].   Status of service:  Completed, signed off Medicare Important Message given?   (If response is "NO", the following Medicare IM given date fields will be blank) Date Medicare IM given:   Medicare IM given by:   Date Additional Medicare IM given:   Additional Medicare IM given by:    Discharge Disposition:  [place]  Per UR Regulation:  Reviewed for med. necessity/level of care/duration of stay  If discussed at [place] of Stay Meetings, dates discussed:    Comments:  06/29/14 Fuller Mandril, RN, BSN, NCM [phone] Pt currently active with [facility] for RN/PT/OT services.  Resumption of care requested.  Hansel Feinstein, RN of [facility] notified.  DME needs identified at this time include oxygen.  Brunilda Payor of [facility] notified to deliver to pt room prior to d/c home.  06/27/14 [place], RN, BSN, [place] [phone] Recent admission at [facility] in the month of June for acute MI following which patient states that she had catheter which was completed by coronary artery dissection patient had emergency CABG and patient had respiratory failure and was intubated following  which patient had tracheostomy and patient also developed renal failure and was on dialysis and right upper extremity DVT for which Coumadin was started on patient was just discharged last week and was sent home when last night patient started feeling short of breath.

## 2014-06-27 NOTE — Discharge Instructions (Signed)

## 2014-06-27 NOTE — Progress Notes (Signed)
[facility] PROGRESS NOTE Interim History: 58 year old female patient with significant history of prolonged hospitalization at [facility] beginning in June of 2015. The admit started with an acute MI. Patient had a cardiac catheterization which was complicated by a coronary artery dissection prompting emergency bypass procedure. Patient had prolonged mechanical ventilation that required tracheostomy and acute renal failure requiring dialysis. In addition she was found to have a right upper extremity DVT and remains on Coumadin for this. She was discharged one week prior to this admit from a rehabilitation facility and sent home. On the night of admission patient developed shortness of breath.  In the ER patient was hypoxemic on room air. CT of the chest was concerning for CHF versus atypical infection. She was given a dose of IV Lasix and was started on empiric antibiotics. She still has a temporary dialysis catheter in place in the upper chest wall.   Filed Weights   06/25/14 0631 06/26/14 0637 06/27/14 0555  Weight: 104.781 kg (231 lb) 104.055 kg (229 lb 6.4 oz) 99.927 kg (220 lb 4.8 oz)        Intake/Output Summary (Last 24 hours) at 06/27/14 1042 Last data filed at 06/27/14 1004  Gross per 24 hour  Intake   1060 ml  Output   1400 ml  Net   -340 ml     Assessment/Plan: Acute respiratory failure with hypoxia due to Acute systolic heart failure/Pulmonary edema/ - TEE 6.19.2015 EF 40-45%% with lateral wall hypokinesis and no apparent diastolic dysfunction  - On exam she is still volume overloaded, hypoalbuminemia contributing to edema. Held lasix on 10.19.2015 due to hypotension. - Bp improved today, Cr improved, will resume lasix. - Weight on 06/17/14 93.8 kg - wgt today 104 kg - monitor electrolytes. Check a b-met i am. - PT eval rec home with PT. - Influenza PCR negative.  Mitral regurgitation (moderate): - Noted on the TEE from 6.19.2015 at [facility].  Anemia  -  Baseline hemoglobin appears to be between 8.9-9.4  Chronic kidney disease stage III  - Baseline creatinine 1.7  - cont to monitor.  Multiple Decubitus skin ulcers  - Patient with multiple decubitus ulcers as follows: 2 on the right lower medial calf, one on the posterior scalp, a large/deep horizontal ulcer stage 3-4 on the low back, and a small sacral ulcer - wound care RN following.  History of RUE DVT  Diagnosed by venous duplex 06/06/14 - cont anticoag   Tracheal stenosis   - Chart from for [facility] reviewed and while trach in place patient did have bleeding from the trach and underwent bronchoscopy at that time with no significant findings or masses noted   Chronic anticoagulation: - Coumadin per pharmacy  - INR therapeutic.  History of MI /S/P CABG  Symptoms do not appear to be ischemic in nature - enzymes negative x3   Hypothyroidism  TSH 6.8, cont synthroid.  Morbid obesity - Body mass index is 47.08 kg/(m^2).   Hypoalbuminemia / protein calorie malnutrition    Code Status: full code.  Family Communication: none.  Disposition Plan: admit to inpatient.     Consultants:  none  Procedures: ECHO: none  Antibiotics:  none   HPI/Subjective: No complains.  Objective: Filed Vitals:   06/26/14 0934 06/26/14 1338 06/26/14 2144 06/27/14 0555  BP: 82/44 99/60 116/78 107/73  Pulse:  88 92 84  Temp:  97.6 F (36.4 C)  98.3 F (36.8 C)  TempSrc:  Oral  Oral  Resp:  18  18 19  Height:      Weight:    99.927 kg (220 lb 4.8 oz)  SpO2:  98% 99% 96%    Exam:  General: Alert, awake, oriented x3, in no acute distress.  HEENT: No bruits, no goiter.  Heart: Regular rate and rhythm Anasarca. Lungs: Good air movement, clear Abdomen: Soft, nontender, nondistended, positive bowel sounds.     Data Reviewed: Basic Metabolic Panel:  Recent Labs Lab 06/23/14 0011 06/23/14 1551 06/24/14 0255 06/25/14 0400 06/27/14 0436  NA 140 141 140 142 141  K 4.1  4.7 4.0 3.6* 3.5*  CL 103 102 102 101 99  CO2 $Re'24 26 24 27 29  'mDU$ GLUCOSE 156* 179* 142* 99 91  BUN 17 21 24* 24* 20  CREATININE 1.58* 1.68* 1.77* 1.86* 1.43*  CALCIUM 8.7 9.1 9.0 8.5 8.8  PHOS  --   --   --  5.2*  --    Liver Function Tests:  Recent Labs Lab 06/23/14 0011 06/23/14 1551 06/25/14 0400  AST 16 13  --   ALT 12 11  --   ALKPHOS 59 55  --   BILITOT 0.3 0.3  --   PROT 6.4 6.3  --   ALBUMIN 2.8* 2.8* 2.7*   No results found for this basename: LIPASE, AMYLASE,  in the last 168 hours No results found for this basename: AMMONIA,  in the last 168 hours CBC:  Recent Labs Lab 06/23/14 0011 06/23/14 1551 06/24/14 0255 06/25/14 0400  WBC 11.2* 9.0 10.4 8.8  NEUTROABS 9.3* 7.9*  --   --   HGB 9.4* 8.8* 8.5* 8.4*  HCT 29.5* 27.8* 26.7* 26.8*  MCV 97.4 95.9 98.2 97.5  PLT 415* 378 365 382   Cardiac Enzymes:  Recent Labs Lab 06/23/14 0011 06/23/14 0750 06/23/14 1551 06/23/14 2105  TROPONINI <0.30 <0.30 <0.30 <0.30   BNP (last 3 results)  Recent Labs  06/23/14 0011  PROBNP 12034.0*   CBG: No results found for this basename: GLUCAP,  in the last 168 hours  Recent Results (from the past 240 hour(s))  CULTURE, BLOOD (ROUTINE X 2)     Status: None   Collection Time    06/23/14  5:06 AM      Result Value Ref Range Status   Specimen Description BLOOD HAND LEFT   Final   Special Requests BOTTLES DRAWN AEROBIC AND ANAEROBIC 10CC   Final   Culture  Setup Time     Final   Value: 06/23/2014 08:46     Performed at [facility]   Culture     Final   Value:        BLOOD CULTURE RECEIVED NO GROWTH TO DATE CULTURE WILL BE HELD FOR 5 DAYS BEFORE ISSUING A FINAL NEGATIVE REPORT     Performed at [facility]   Report Status PENDING   Incomplete  CULTURE, BLOOD (ROUTINE X 2)     Status: None   Collection Time    06/23/14  7:30 AM      Result Value Ref Range Status   Specimen Description BLOOD HAND LEFT   Final   Special Requests BOTTLES DRAWN  AEROBIC AND ANAEROBIC 5ML   Final   Culture  Setup Time     Final   Value: 06/23/2014 17:08     Performed at [facility]   Culture     Final   Value:        BLOOD CULTURE RECEIVED NO GROWTH TO DATE  CULTURE WILL BE HELD FOR 5 DAYS BEFORE ISSUING A FINAL NEGATIVE REPORT     Performed at [facility]   Report Status PENDING   Incomplete  MRSA PCR SCREENING     Status: None   Collection Time    06/23/14 11:46 AM      Result Value Ref Range Status   MRSA by PCR NEGATIVE  NEGATIVE Final   Comment:            The GeneXpert MRSA Assay (FDA     approved for NASAL specimens     only), is one component of a     comprehensive MRSA colonization     surveillance program. It is not     intended to diagnose MRSA     infection nor to guide or     monitor treatment for     MRSA infections.     Studies: No results found.  Scheduled Meds: . ALPRAZolam  1 mg Oral BID  . aspirin EC  81 mg Oral Daily  . clotrimazole  1 Applicatorful Vaginal B1660  . furosemide  40 mg Intravenous Q12H  . levothyroxine  125 mcg Oral QAC breakfast  . metoprolol tartrate  12.5 mg Oral BID  . pantoprazole  40 mg Oral Daily  . sodium chloride  3 mL Intravenous Q12H  . warfarin  4 mg Oral q1800  . Warfarin - Pharmacist Dosing Inpatient   Does not apply q1800   Continuous Infusions:    Charlynne Cousins  [facility] Pager [phone] If 8PM-8AM, please contact night-coverage at [email or web address], password [facility] 06/27/2014, 10:42 AM  LOS: 5 days

## 2014-06-27 NOTE — Progress Notes (Signed)
Attending MD paged and notified of patient's blood culture results and was also informed again of patient's potassium level. New order written.

## 2014-06-27 NOTE — Progress Notes (Signed)
Physical Therapy Treatment Patient Details Name: Laura Lane MRN: [id number] DOB: [birth year] Today's Date: 06/27/2014    History of Present Illness Pt. admitted 06/22/14 with SOB, hypoxemia, acute respiratory failure due to systolic HF/pulmonary edema.  Pt. has lengthy     PT Comments    Pt progressing, incr amb distance today; Pt requires encouragement to participate with PT.   Follow Up Recommendations  Home health PT;Supervision for mobility/OOB     Equipment Recommendations  None recommended by PT    Recommendations for Other Services       Precautions / Restrictions Precautions Precautions: Fall Restrictions Weight Bearing Restrictions: No    Mobility  Bed Mobility Overal bed mobility: Needs Assistance Bed Mobility: Supine to Sit;Sit to Supine     Supine to sit: Min guard Sit to supine: Min assist   General bed mobility comments: min assist needed to manage LEs  Transfers Overall transfer level: Needs assistance Equipment used: Rolling walker (2 wheeled) Transfers: Sit to/from Stand Sit to Stand: Min guard;Min assist Stand pivot transfers: Min assist       General transfer comment: min assist for wt shift to stand, cues for control of descent  Ambulation/Gait Ambulation/Gait assistance: Min guard Ambulation Distance (Feet): 50 Feet Assistive device: Rolling walker (2 wheeled) Gait Pattern/deviations: Step-through pattern;Decreased stride length   Gait velocity interpretation: Below normal speed for age/gender     Stairs            Wheelchair Mobility    Modified Rankin (Stroke Patients Only)       Balance Overall balance assessment: Needs assistance         Standing balance support: During functional activity;Bilateral upper extremity supported Standing balance-Leahy Scale: Poor Standing balance comment: requires UE support to maintain balance                    Cognition Arousal/Alertness: Awake/alert Behavior  During Therapy: WFL for tasks assessed/performed Overall Cognitive Status: Within Functional Limits for tasks assessed                      Exercises      General Comments        Pertinent Vitals/Pain Pain Assessment: No/denies pain  SATURATION QUALIFICATIONS: (This note is used to comply with regulatory documentation for home oxygen)  Patient Saturations on Room Air at Rest = 94%  Patient Saturations on Room Air while Ambulating = 84%  Patient Saturations after amb 85%, O2 replaced and O2 returned to >95% after <34min     Home Living Family/patient expects to be discharged to:: Private residence Living Arrangements: Children;Spouse/significant other (15 year old and 45 year old children, 1 grandchild) Available Help at Discharge: Family;Available 24 hours/day Type of Home: House Home Access: Ramped entrance   Home Layout: One level Home Equipment: Wheelchair - manual;Bedside commode;Walker - 2 wheels;Tub bench;Other (comment);Adaptive equipment (has access to tub bench)      Prior Function Level of Independence: Needs assistance    ADL's / Homemaking Assistance Needed: daughter does cooking, pt was cleaning home PTA     PT Goals (current goals can now be found in the care plan section) Acute Rehab PT Goals Patient Stated Goal: home with home health and family PT Goal Formulation: With patient Time For Goal Achievement: 07/09/14 Potential to Achieve Goals: Good Progress towards PT goals: Progressing toward goals    Frequency  Min 3X/week    PT Plan Current plan remains appropriate    Co-evaluation  End of Session Equipment Utilized During Treatment: Gait belt Activity Tolerance: Patient tolerated treatment well Patient left: in bed;with call bell/phone within reach     Time: 1135-1159 PT Time Calculation (min): 24 min  Charges:  $Gait Training: 23-37 mins                    G Codes:      Saylee Sherrill 2014/07/06, 12:56  PM

## 2014-06-27 NOTE — Evaluation (Signed)
Occupational Therapy Evaluation Patient Details Name: Laura Lane MRN: [id number] DOB: September 12, 1955 Today's Date: 06/27/2014    History of Present Illness Pt. admitted 06/22/14 with SOB, hypoxemia, acute respiratory failure due to systolic HF/pulmonary edema.  Pt. has lengthy    Clinical Impression   Pt was performing ADL at a modified independent level prior to admission.  She now requires minimum assistance due to generalized weakness, decreased balance, and poor activity tolerance.  Instructed pt in edema management for R UE and breathing techniques.  Will continue to follow.   Follow Up Recommendations  Home health OT    Equipment Recommendations  None recommended by OT    Recommendations for Other Services       Precautions / Restrictions Precautions Precautions: Fall Restrictions Weight Bearing Restrictions: No      Mobility Bed Mobility Overal bed mobility: Needs Assistance Bed Mobility: Supine to Sit;Sit to Supine     Supine to sit: Min assist Sit to supine: Min assist   General bed mobility comments: min assist needed to manage LEs  Transfers Overall transfer level: Needs assistance Equipment used: Rolling walker (2 wheeled) Transfers: Stand Pivot Transfers   Stand pivot transfers: Min assist            Balance                                            ADL Overall ADL's : Needs assistance/impaired Eating/Feeding: Set up;Sitting   Grooming: Wash/dry hands;Wash/dry face;Sitting;Set up   Upper Body Bathing: Set up;Sitting   Lower Body Bathing: Minimal assistance;Sit to/from stand   Upper Body Dressing : Set up;Sitting   Lower Body Dressing: Minimal assistance;Sit to/from stand   Toilet Transfer: Minimal assistance;BSC;Stand-pivot           Functional mobility during ADLs: Minimal assistance General ADL Comments: Instructed pt to elevate R UE on 3 pillows when she returns to bed or to chair.     Vision                      Perception     Praxis      Pertinent Vitals/Pain Pain Assessment: No/denies pain     Hand Dominance Right   Extremity/Trunk Assessment Upper Extremity Assessment Upper Extremity Assessment: RUE deficits/detail RUE Deficits / Details: moderate edema since heart sx in June, uses L as lead frequently. RUE Coordination: decreased fine motor;decreased gross motor   Lower Extremity Assessment Lower Extremity Assessment: Defer to PT evaluation   Cervical / Trunk Assessment Cervical / Trunk Assessment: Normal   Communication Communication Communication: No difficulties   Cognition Arousal/Alertness: Awake/alert Behavior During Therapy: WFL for tasks assessed/performed Overall Cognitive Status: Within Functional Limits for tasks assessed                     General Comments       Exercises       Shoulder Instructions      Home Living Family/patient expects to be discharged to:: Private residence Living Arrangements: Children;Spouse/significant other (14 year old and 77 year old children, 1 grandchild) Available Help at Discharge: Family;Available 24 hours/day Type of Home: House Home Access: Ramped entrance     Home Layout: One level     Bathroom Shower/Tub: Teacher, early years/pre: Standard     Home Equipment: Wheelchair - manual;Bedside commode;Walker -  2 wheels;Tub bench;Other (comment);Adaptive equipment (has access to tub bench) Adaptive Equipment: Reacher        Prior Functioning/Environment Level of Independence: Needs assistance    ADL's / Homemaking Assistance Needed: daughter does cooking, pt was cleaning home PTA        OT Diagnosis: Generalized weakness   OT Problem List: Decreased strength;Impaired balance (sitting and/or standing);Decreased knowledge of use of DME or AE;Obesity;Impaired UE functional use;Increased edema   OT Treatment/Interventions: Self-care/ADL training;Energy conservation;DME and/or  AE instruction;Therapeutic activities;Patient/family education    OT Goals(Current goals can be found in the care plan section) Acute Rehab OT Goals Patient Stated Goal: home with home health and family OT Goal Formulation: With patient Time For Goal Achievement: 07/11/14 Potential to Achieve Goals: Good ADL Goals Pt Will Perform Grooming: with supervision;standing Pt Will Perform Lower Body Bathing: with supervision;with adaptive equipment;sit to/from stand Pt Will Perform Lower Body Dressing: with supervision;with adaptive equipment;sit to/from stand Pt Will Transfer to Toilet: with supervision;ambulating;bedside commode (over toilet) Pt Will Perform Toileting - Clothing Manipulation and hygiene: with supervision;sit to/from stand Additional ADL Goal #1: Pt will generalize energy conservation strategies and breathing techniques in ADL with minimal verbal cues.  OT Frequency: Min 2X/week   Barriers to D/C:            Co-evaluation              End of Session Equipment Utilized During Treatment: Rolling walker;Oxygen  Activity Tolerance: Patient limited by fatigue Patient left: in bed;with call bell/phone within reach   Time: 6301-6010 OT Time Calculation (min): 24 min Charges:  OT General Charges $OT Visit: 1 Procedure OT Evaluation $Initial OT Evaluation Tier I: 1 Procedure OT Treatments $Self Care/Home Management : 8-22 mins G-Codes:    Malka So 06/27/2014, 9:21 AM [phone]

## 2014-06-28 DIAGNOSIS — N183 Chronic kidney disease, stage 3 (moderate): Secondary | ICD-10-CM

## 2014-06-28 DIAGNOSIS — Z7901 Long term (current) use of anticoagulants: Secondary | ICD-10-CM

## 2014-06-28 DIAGNOSIS — J9601 Acute respiratory failure with hypoxia: Secondary | ICD-10-CM

## 2014-06-28 LAB — PROTIME-INR
INR: 2.07 — ABNORMAL HIGH (ref 0.00–1.49)
Prothrombin Time: 23.5 seconds — ABNORMAL HIGH (ref 11.6–15.2)

## 2014-06-28 MED ORDER — WARFARIN - PHARMACIST DOSING INPATIENT
Freq: Every day | Status: DC
  Administered 2014-06-28: 18:00:00

## 2014-06-28 MED ORDER — WARFARIN SODIUM 7.5 MG PO TABS
7.5000 mg | ORAL_TABLET | Freq: Once | ORAL | Status: AC
  Administered 2014-06-28: 7.5 mg via ORAL
  Filled 2014-06-28: qty 1

## 2014-06-28 MED ORDER — POTASSIUM CHLORIDE CRYS ER 20 MEQ PO TBCR
40.0000 meq | EXTENDED_RELEASE_TABLET | Freq: Once | ORAL | Status: AC
  Administered 2014-06-28: 40 meq via ORAL
  Filled 2014-06-28: qty 2

## 2014-06-28 MED ORDER — WARFARIN SODIUM 5 MG PO TABS
5.0000 mg | ORAL_TABLET | Freq: Once | ORAL | Status: DC
  Filled 2014-06-28: qty 1

## 2014-06-28 NOTE — Progress Notes (Signed)
ANTICOAGULATION CONSULT NOTE - Follow Up Consult  Pharmacy Consult for Coumadin Indication: DVT  No Known Allergies  Patient Measurements: Height: 4\' 11"  (149.9 cm) Weight: 217 lb 6.4 oz (98.612 kg) (bed scale) IBW/kg (Calculated) : 43.2 Heparin Dosing Weight:   Vital Signs: Temp: 98.3 F (36.8 C) (10/21 1119) Temp Source: Oral (10/21 1119) BP: 95/62 mmHg (10/21 1119) Pulse Rate: 77 (10/21 1119)  Labs:  Recent Labs  06/26/14 0455 06/27/14 0436 06/28/14 0339  LABPROT 24.9* 23.8* 23.5*  INR 2.23* 2.11* 2.07*  CREATININE  --  1.43*  --     Estimated Creatinine Clearance: 44.8 ml/min (by C-G formula based on Cr of 1.43).  Assessment: 58 y/o F with recent admission at [facility] where she had a complicated stay including MI>>cath with coronary artery dissection>> emergency bypass, VDFR>>trach, AFR>>dialysis, and DVT. Admitted 06/22/2014 to [facility] with SOB. Multiple decub. Ulcers.  PMH: recent CABG (2nd aortic dissection post cath), CAD, HF, hypothryoid, COPD,   AntiCoag: Coum-Rx: recent RUE DVT: INR is 2.07 down again (warfarin PTA dose: 4mg /day, LD 10/15). Hgb only 8.4.  ID: Abx d/c'd 10/17. Afeb, WBC 8.8, CT with patchy airspace disease. Watch blood cultures. Possibly contaminant. 10/16 vanc> 10/17 10/16 cefepime> 10/17 10/16 BCx x 2 > GPR x 1  Cardiovascular: Hx MI, CHF 40-45%, CABG - BP 95/62, HR 77 on ASA81, metoprolol, IV Lasix. Monitor hypotension  Endocrinology: Synthroid  Gastrointestinal / Nutritio: po PPI. Hypoalbumin. BMI 47.  Neurology: Scheduled Xanax  Nephrology: CKD-3. Scr 1.43 down, CrCl ~ 45. Required HD at recent admission at [facility], with temp HD cath still in place  Pulmonary: RA  Hematology / Oncology: Hgb 8.4, Pltc wnl (stable)  PTA Medication Issues: resumed appropriately  Best Practices: warfarin, po Ptx   Goal of Therapy:  INR 2-3 Monitor platelets by anticoagulation protocol: Yes   Plan:  Coumadin 7.5mg  po x 1 tonight then  increase to 5mg  daily. Daily INR   Rolonda Pontarelli S. Alford Highland, PharmD, BCPS Clinical Staff Pharmacist Pager [phone]  Eilene Ghazi [place]illing[place] 06/28/2014,11:20 AM

## 2014-06-28 NOTE — Progress Notes (Signed)
Dressing to lower back changed per order.  Minimal drainage noted. RN will continue to monitor. Shellee Milo, RN

## 2014-06-28 NOTE — Discharge Summary (Addendum)
Physician Discharge Summary  Laura Lane ZOX:[id number] DOB: 09-27-1955 DOA: 06/22/2014  PCP: Is in [facility]  Admit date: 06/22/2014 Discharge date: 06/29/2014  Time spent: 45 minutes   Discharge Condition: stable Diet recommendation: heart healthy  Discharge Diagnoses:  Principal Problem:   Acute respiratory failure with hypoxia Active Problems:   Tracheal stenosis   Acute systolic congestive heart failure   History of MI (myocardial infarction)   Anemia   Hypothyroidism   CKD (chronic kidney disease), stage III   Multiple Decubitus skin ulcers   S/P CABG (coronary artery bypass graft)   Morbid obesity with BMI of 45.0-49.9, adult   History of RUE DVT    Chronic anticoagulation   Mitral regurgitation (moderate)   History of present illness:  58 year old female patient with significant history of prolonged hospitalization at [facility] beginning in June of 2015. She was admitted with an acute MI. The patient had a cardiac catheterization which was complicated by a coronary artery dissection prompting emergency bypass procedure. She had prolonged mechanical ventilation that required tracheostomy and acute renal failure requiring dialysis. In addition she was found to have a right upper extremity DVT and remains on Coumadin for this. She was discharged one week prior to this admit from a rehabilitation facility and sent home. On the night of admission patient developed shortness of breath.  In the ER patient was hypoxemic on room air. CT of the chest was concerning for CHF versus atypical infection. She was given a dose of IV Lasix and was started on empiric antibiotics. She still had a temporary dialysis catheter in place in the upper chest wall which we have removed.    Hospital Course:  Acute respiratory failure with hypoxia --  Acute systolic heart failure - TEE 6.19.2015 EF 40-45%% with lateral wall hypokinesis and no apparent diastolic dysfunction  - Hypoalbuminemia is  suspected to be contributing to edema.  - Weight on 06/17/14 93.8 kg - wgt today 99 kg  - Lisinopril 2.5 mg daily has been added- Lasix is being continued- she is advised to watch her weights closely - 2 L of O2 is being added as well to be used continuously - she may need to be assessed for OSA as well.   Hypokalemia - potassium replaced   Mitral regurgitation (moderate):  - Noted on the TEE from 6.19.2015 at [facility].   Anemia  - stable   Chronic kidney disease stage III  - Baseline creatinine 1.7 improved to 1.4 today - cont to monitor.   Multiple Decubitus skin ulcers  - Patient with multiple decubitus ulcers as follows: 2 on the right lower medial calf, one on the posterior scalp, a large/deep horizontal ulcer stage 3-4 on the low back, and a small sacral ulcer - wound care RN has been following - the patient's family is comfortable with doing dressings at home- per patient, her PCP was going to set her up with outpt wound care as well.    History of RUE DVT  Diagnosed by venous duplex 06/06/14 - cont anticoag   Tracheal stenosis  - Chart from for [facility] reviewed and while trach in place patient did have bleeding from the trach and underwent bronchoscopy at that time with no significant findings or masses noted - High grade tracheal stenosis noted on CT obtained in the ER- no stridor on exam  History of MI /S/P CABG  Symptoms do not appear to be ischemic in nature - enzymes negative x3  - diffuse atherosclerosis noted  in coronaries on CT  Hypothyroidism  TSH 6.8 (likely has sick euthyroid syndrome)- no additional testing done-  cont synthroid.   Morbid obesity - Body mass index is 47.08 kg/(m^2).   Hypoalbuminemia / protein calorie malnutrition    Procedures:  none  Consultations:  none  Discharge Exam: Filed Weights   06/27/14 0555 06/28/14 0651 06/29/14 0458  Weight: 99.927 kg (220 lb 4.8 oz) 98.612 kg (217 lb 6.4 oz) 99.6 kg (219 lb 9.3 oz)    Filed Vitals:   06/29/14 0832  BP: 100/57  Pulse: 86  Temp: 98.4 F (36.9 C)  Resp: 18    General: AAO x 3, no distress Cardiovascular: RRR, no murmurs  Respiratory: crackles at b/l bases  GI: soft, non-tender, non-distended, bowel sound positive Skin: multiple decubitus ulcers as follows: 2 on the right lower medial calf, one on the posterior scalp, a large/deep horizontal ulcer stage 3-4 on the low back, and a small sacral ulcer- all currently appear to be clean and free of infection  Discharge Instructions You were cared for by a hospitalist during your hospital stay. If you have any questions about your discharge medications or the care you received while you were in the hospital after you are discharged, you can call the unit and asked to speak with the hospitalist on call if the hospitalist that took care of you is not available. Once you are discharged, your primary care physician will handle any further medical issues. Please note that NO REFILLS for any discharge medications will be authorized once you are discharged, as it is imperative that you return to your primary care physician (or establish a relationship with a primary care physician if you do not have one) for your aftercare needs so that they can reassess your need for medications and monitor your lab values.      Discharge Instructions   Diet - low sodium heart healthy    Complete by:  As directed      Increase activity slowly    Complete by:  As directed             Medication List         ALPRAZolam 1 MG tablet  Commonly known as:  XANAX  Take 1 mg by mouth 2 (two) times daily.     aspirin EC 81 MG tablet  Take 81 mg by mouth daily.     furosemide 40 MG tablet  Commonly known as:  LASIX  Take 40 mg by mouth daily.     levothyroxine 125 MCG tablet  Commonly known as:  SYNTHROID, LEVOTHROID  Take 125 mcg by mouth daily before breakfast.     metoprolol tartrate 25 MG tablet  Commonly known as:   LOPRESSOR  Take 12.5 mg by mouth 2 (two) times daily.     morphine 15 MG tablet  Commonly known as:  MSIR  Take 15 mg by mouth every 4 (four) hours as needed for severe pain.     pantoprazole 40 MG tablet  Commonly known as:  PROTONIX  Take 40 mg by mouth daily.     polyethylene glycol packet  Commonly known as:  MIRALAX / GLYCOLAX  Take 17 g by mouth daily as needed for mild constipation or moderate constipation.     warfarin 4 MG tablet  Commonly known as:  COUMADIN  Take 4 mg by mouth daily.       No Known Allergies    The results of significant diagnostics  from this hospitalization (including imaging, microbiology, ancillary and laboratory) are listed below for reference.    Significant Diagnostic Studies: Ct Angio Chest W/cm &/or Wo Cm  06/23/2014   CLINICAL DATA:  Respiratory distress and hypoxia.  EXAM: CT ANGIOGRAPHY CHEST WITH CONTRAST  TECHNIQUE: Multidetector CT imaging of the chest was performed using the standard protocol during bolus administration of intravenous contrast. Multiplanar CT image reconstructions and MIPs were obtained to evaluate the vascular anatomy.  CONTRAST:  66mL OMNIPAQUE IOHEXOL 350 MG/ML SOLN  COMPARISON:  None.  FINDINGS: THORACIC INLET/BODY WALL:  Dialysis catheter from the right, with tip at the upper right atrium.  There is scarring in keeping with a previous tracheostomy. There is narrowing of the trachea at the thoracic inlet to 9 x 4 mm. No infiltrating mass is seen in this region.  MEDIASTINUM:  Cardiomegaly, with notable enlargement of the left heart. There is diffuse atherosclerosis, including the coronary arteries. The patient is status post CABG. Contrast timing results in limited opacification of the aorta and saphenous grafts. No evidence for aortic dissection or aneurysm. There is no evidence for pulmonary embolism. No adenopathy.  LUNG WINDOWS:  Small, predominantly layering bilateral pleural effusions with passive atelectasis. There  is mild interlobular septal thickening at the bases. Patchy opacity in the upper lobes which roughly conform to secondary pulmonary lobules. These are fairly symmetric and distribution.  UPPER ABDOMEN:  No acute findings.  OSSEOUS:  No acute fracture.  No suspicious lytic or blastic lesions.  Review of the MIP images confirms the above findings.  IMPRESSION: 1. Negative for pulmonary embolism. 2. High-grade tracheal stenosis at the thoracic inlet, likely scarring from remote tracheostomy tube. Minimal diameter measures 9 x 4 mm. 3. Patchy bilateral airspace disease favors pulmonary edema over atypical infection. There are small bilateral pleural effusions.   Electronically Signed   By: Jorje Guild M.D.   On: 06/23/2014 03:42   Dg Chest Portable 1 View  06/23/2014   CLINICAL DATA:  Respiratory distress.  Initial encounter.  EXAM: PORTABLE CHEST - 1 VIEW  COMPARISON:  02/16/2014  FINDINGS: Grossly unchanged enlarged cardiac silhouette and mediastinal contours. Post median sternotomy and CABG. Interval placement of a right subclavian vein approach dialysis catheter with tip projected over the superior cavoatrial junction. The pulmonary vasculature is indistinct with cephalization of flow. Interval development small bilateral effusions and associated bibasilar heterogeneous/consolidative opacities, left greater than right. No pneumothorax. Unchanged bones.  IMPRESSION: Findings worrisome for pulmonary edema with small bilateral effusions and associated bibasilar opacities, left greater than right, atelectasis versus infiltrate.   Electronically Signed   By: Sandi Mariscal M.D.   On: 06/23/2014 00:44    Microbiology: Recent Results (from the past 240 hour(s))  CULTURE, BLOOD (ROUTINE X 2)     Status: None   Collection Time    06/23/14  5:06 AM      Result Value Ref Range Status   Specimen Description BLOOD HAND LEFT   Final   Special Requests BOTTLES DRAWN AEROBIC AND ANAEROBIC 10CC   Final   Culture   Setup Time     Final   Value: 06/23/2014 08:46     Performed at [facility]   Culture     Final   Value: PROPIONIBACTERIUM SPECIES     Note: Gram Stain Report Called to,Read Back By and Verified With: Jackquline Berlin 06/27/14 1245 BY SMITHERSJ     Performed at [facility]   Report Status 06/29/2014 FINAL  Final  CULTURE, BLOOD (ROUTINE X 2)     Status: None   Collection Time    06/23/14  7:30 AM      Result Value Ref Range Status   Specimen Description BLOOD HAND LEFT   Final   Special Requests BOTTLES DRAWN AEROBIC AND ANAEROBIC 5ML   Final   Culture  Setup Time     Final   Value: 06/23/2014 17:08     Performed at [facility]   Culture     Final   Value: NO GROWTH 5 DAYS     Performed at [facility]   Report Status 06/29/2014 FINAL   Final  MRSA PCR SCREENING     Status: None   Collection Time    06/23/14 11:46 AM      Result Value Ref Range Status   MRSA by PCR NEGATIVE  NEGATIVE Final   Comment:            The GeneXpert MRSA Assay (FDA     approved for NASAL specimens     only), is one component of a     comprehensive MRSA colonization     surveillance program. It is not     intended to diagnose MRSA     infection nor to guide or     monitor treatment for     MRSA infections.     Labs: Basic Metabolic Panel:  Recent Labs Lab 06/23/14 0011 06/23/14 1551 06/24/14 0255 06/25/14 0400 06/27/14 0436  NA 140 141 140 142 141  K 4.1 4.7 4.0 3.6* 3.5*  CL 103 102 102 101 99  CO2 24 26 24 27 29   GLUCOSE 156* 179* 142* 99 91  BUN 17 21 24* 24* 20  CREATININE 1.58* 1.68* 1.77* 1.86* 1.43*  CALCIUM 8.7 9.1 9.0 8.5 8.8  PHOS  --   --   --  5.2*  --    Liver Function Tests:  Recent Labs Lab 06/23/14 0011 06/23/14 1551 06/25/14 0400  AST 16 13  --   ALT 12 11  --   ALKPHOS 59 55  --   BILITOT 0.3 0.3  --   PROT 6.4 6.3  --   ALBUMIN 2.8* 2.8* 2.7*   No results found for this basename: LIPASE, AMYLASE,  in the last 168  hours No results found for this basename: AMMONIA,  in the last 168 hours CBC:  Recent Labs Lab 06/23/14 0011 06/23/14 1551 06/24/14 0255 06/25/14 0400  WBC 11.2* 9.0 10.4 8.8  NEUTROABS 9.3* 7.9*  --   --   HGB 9.4* 8.8* 8.5* 8.4*  HCT 29.5* 27.8* 26.7* 26.8*  MCV 97.4 95.9 98.2 97.5  PLT 415* 378 365 382   Cardiac Enzymes:  Recent Labs Lab 06/23/14 0011 06/23/14 0750 06/23/14 1551 06/23/14 2105  TROPONINI <0.30 <0.30 <0.30 <0.30   BNP: BNP (last 3 results)  Recent Labs  06/23/14 0011  PROBNP 12034.0*   CBG: No results found for this basename: GLUCAP,  in the last 168 hours     Signed:  Debbe Odea, MD [facility] 06/29/2014, 12:26 PM

## 2014-06-28 NOTE — Progress Notes (Signed)
[facility] PROGRESS NOTE Interim History: 58 year old female patient with significant history of prolonged hospitalization at [facility] beginning in June of 2015. The admit started with an acute MI. Patient had a cardiac catheterization which was complicated by a coronary artery dissection prompting emergency bypass procedure. Patient had prolonged mechanical ventilation that required tracheostomy and acute renal failure requiring dialysis. In addition she was found to have a right upper extremity DVT and remains on Coumadin for this. She was discharged one week prior to this admit from a rehabilitation facility and sent home. On the night of admission patient developed shortness of breath.  In the ER patient was hypoxemic on room air. CT of the chest was concerning for CHF versus atypical infection. She was given a dose of IV Lasix and was started on empiric antibiotics. She still has a temporary dialysis catheter in place in the upper chest wall.   Filed Weights   06/26/14 0637 06/27/14 0555 06/28/14 0651  Weight: 104.055 kg (229 lb 6.4 oz) 99.927 kg (220 lb 4.8 oz) 98.612 kg (217 lb 6.4 oz)        Intake/Output Summary (Last 24 hours) at 06/28/14 1411 Last data filed at 06/28/14 1157  Gross per 24 hour  Intake    243 ml  Output   2226 ml  Net  -1983 ml     Assessment/Plan: Acute respiratory failure with hypoxia due to Acute systolic heart failure/Pulmonary edema/ - TEE 6.19.2015 EF 40-45%% with lateral wall hypokinesis and no apparent diastolic dysfunction  - On exam she is still volume overloaded, hypoalbuminemia contributing to edema. Held lasix on 10.19.2015 due to hypotension. - Bp improved today, Cr improved, will resume lasix. - Weight on 06/17/14 93.8 kg - wgt today 98 kg - monitor electrolytes. Check a b-met in am. - PT eval rec home with PT. - Influenza PCR negative.  Mitral regurgitation (moderate): - Noted on the TEE from 6.19.2015 at [facility].  Anemia    - Baseline hemoglobin appears to be between 8.9-9.4  Chronic kidney disease stage III  - Baseline creatinine 1.7 - improved to 1.4 today - cont to monitor.  Multiple Decubitus skin ulcers  - Patient with multiple decubitus ulcers as follows: 2 on the right lower medial calf, one on the posterior scalp, a large/deep horizontal ulcer stage 3-4 on the low back, and a small sacral ulcer - wound care RN following.  History of RUE DVT  Diagnosed by venous duplex 06/06/14 - cont anticoag   Tracheal stenosis   - Chart from for [facility] reviewed and while trach in place patient did have bleeding from the trach and underwent bronchoscopy at that time with no significant findings or masses noted   Chronic anticoagulation: - Coumadin per pharmacy  - INR therapeutic.  History of MI /S/P CABG  Symptoms do not appear to be ischemic in nature - enzymes negative x3   Hypothyroidism  TSH 6.8, cont synthroid.  Morbid obesity - Body mass index is 47.08 kg/(m^2).   Hypoalbuminemia / protein calorie malnutrition    Code Status: full code.  Family Communication: none.  Disposition Plan: admit to inpatient.     Consultants:  none  Procedures: ECHO: none  Antibiotics:  none   HPI/Subjective: No complains.  Objective: Filed Vitals:   06/27/14 2213 06/28/14 0620 06/28/14 0651 06/28/14 1119  BP: 99/62 94/55  95/62  Pulse: 92 86  77  Temp: 97.6 F (36.4 C) 97.8 F (36.6 C)  98.3 F (36.8 C)  TempSrc: Oral Oral  Oral  Resp: 18 18    Height:      Weight:   98.612 kg (217 lb 6.4 oz)   SpO2: 96% 97%      Exam:  General: Alert, awake, oriented x3, in no acute distress.  HEENT: No bruits, no goiter.  Heart: Regular rate and rhythm Anasarca. Lungs: Good air movement, clear Abdomen: Soft, nontender, nondistended, positive bowel sounds.     Data Reviewed: Basic Metabolic Panel:  Recent Labs Lab 06/23/14 0011 06/23/14 1551 06/24/14 0255 06/25/14 0400  06/27/14 0436  NA 140 141 140 142 141  K 4.1 4.7 4.0 3.6* 3.5*  CL 103 102 102 101 99  CO2 $Re'24 26 24 27 29  'pjE$ GLUCOSE 156* 179* 142* 99 91  BUN 17 21 24* 24* 20  CREATININE 1.58* 1.68* 1.77* 1.86* 1.43*  CALCIUM 8.7 9.1 9.0 8.5 8.8  PHOS  --   --   --  5.2*  --    Liver Function Tests:  Recent Labs Lab 06/23/14 0011 06/23/14 1551 06/25/14 0400  AST 16 13  --   ALT 12 11  --   ALKPHOS 59 55  --   BILITOT 0.3 0.3  --   PROT 6.4 6.3  --   ALBUMIN 2.8* 2.8* 2.7*   No results found for this basename: LIPASE, AMYLASE,  in the last 168 hours No results found for this basename: AMMONIA,  in the last 168 hours CBC:  Recent Labs Lab 06/23/14 0011 06/23/14 1551 06/24/14 0255 06/25/14 0400  WBC 11.2* 9.0 10.4 8.8  NEUTROABS 9.3* 7.9*  --   --   HGB 9.4* 8.8* 8.5* 8.4*  HCT 29.5* 27.8* 26.7* 26.8*  MCV 97.4 95.9 98.2 97.5  PLT 415* 378 365 382   Cardiac Enzymes:  Recent Labs Lab 06/23/14 0011 06/23/14 0750 06/23/14 1551 06/23/14 2105  TROPONINI <0.30 <0.30 <0.30 <0.30   BNP (last 3 results)  Recent Labs  06/23/14 0011  PROBNP 12034.0*   CBG: No results found for this basename: GLUCAP,  in the last 168 hours  Recent Results (from the past 240 hour(s))  CULTURE, BLOOD (ROUTINE X 2)     Status: None   Collection Time    06/23/14  5:06 AM      Result Value Ref Range Status   Specimen Description BLOOD HAND LEFT   Final   Special Requests BOTTLES DRAWN AEROBIC AND ANAEROBIC 10CC   Final   Culture  Setup Time     Final   Value: 06/23/2014 08:46     Performed at [facility]   Culture     Final   Value: GRAM POSITIVE RODS     Note: Gram Stain Report Called to,Read Back By and Verified With: Jackquline Berlin 06/27/14 1245 BY SMITHERSJ     Performed at [facility]   Report Status PENDING   Incomplete  CULTURE, BLOOD (ROUTINE X 2)     Status: None   Collection Time    06/23/14  7:30 AM      Result Value Ref Range Status   Specimen Description  BLOOD HAND LEFT   Final   Special Requests BOTTLES DRAWN AEROBIC AND ANAEROBIC 5ML   Final   Culture  Setup Time     Final   Value: 06/23/2014 17:08     Performed at [facility]   Culture     Final   Value:        BLOOD  CULTURE RECEIVED NO GROWTH TO DATE CULTURE WILL BE HELD FOR 5 DAYS BEFORE ISSUING A FINAL NEGATIVE REPORT     Performed at [facility]   Report Status PENDING   Incomplete  MRSA PCR SCREENING     Status: None   Collection Time    06/23/14 11:46 AM      Result Value Ref Range Status   MRSA by PCR NEGATIVE  NEGATIVE Final   Comment:            The GeneXpert MRSA Assay (FDA     approved for NASAL specimens     only), is one component of a     comprehensive MRSA colonization     surveillance program. It is not     intended to diagnose MRSA     infection nor to guide or     monitor treatment for     MRSA infections.     Studies: No results found.  Scheduled Meds: . ALPRAZolam  1 mg Oral BID  . aspirin EC  81 mg Oral Daily  . clotrimazole  1 Applicatorful Vaginal Q3300  . furosemide  40 mg Intravenous Q12H  . levothyroxine  125 mcg Oral QAC breakfast  . metoprolol tartrate  12.5 mg Oral BID  . pantoprazole  40 mg Oral Daily  . sodium chloride  3 mL Intravenous Q12H  . [START ON 06/29/2014] warfarin  5 mg Oral ONCE-1800  . warfarin  7.5 mg Oral ONCE-1800  . Warfarin - Pharmacist Dosing Inpatient   Does not apply q1800   Continuous Infusions:    [facility]  [facility] Pager [phone] If 8PM-8AM, please contact night-coverage at [email or web address], password [facility] 06/28/2014, 2:11 PM  LOS: 6 days

## 2014-06-29 DIAGNOSIS — I5021 Acute systolic (congestive) heart failure: Principal | ICD-10-CM

## 2014-06-29 LAB — CULTURE, BLOOD (ROUTINE X 2): CULTURE: NO GROWTH

## 2014-06-29 LAB — PROTIME-INR
INR: 1.82 — ABNORMAL HIGH (ref 0.00–1.49)
Prothrombin Time: 21.2 seconds — ABNORMAL HIGH (ref 11.6–15.2)

## 2014-06-29 MED ORDER — WARFARIN SODIUM 7.5 MG PO TABS
7.5000 mg | ORAL_TABLET | Freq: Once | ORAL | Status: DC
  Filled 2014-06-29: qty 1

## 2014-06-29 MED ORDER — LISINOPRIL 2.5 MG PO TABS: 2.5000 mg | ORAL_TABLET | Freq: Every day | ORAL | Status: DC

## 2014-06-29 MED ORDER — POLYETHYLENE GLYCOL 3350 17 G PO PACK: 17.0000 g | PACK | Freq: Every day | ORAL | Status: DC | PRN

## 2014-06-29 NOTE — Progress Notes (Signed)
ANTICOAGULATION CONSULT NOTE - Follow Up Consult  Pharmacy Consult for Coumadin Indication: DVT  No Known Allergies  Patient Measurements: Height: 4\' 11"  (149.9 cm) Weight: 219 lb 9.3 oz (99.6 kg) IBW/kg (Calculated) : 43.2 Heparin Dosing Weight:   Vital Signs: Temp: 98.4 F (36.9 C) (10/22 0832) Temp Source: Oral (10/22 0832) BP: 100/57 mmHg (10/22 0832) Pulse Rate: 86 (10/22 0832)  Labs:  Recent Labs  06/27/14 0436 06/28/14 0339 06/29/14 0428  LABPROT 23.8* 23.5* 21.2*  INR 2.11* 2.07* 1.82*  CREATININE 1.43*  --   --     Estimated Creatinine Clearance: 45.1 ml/min (by C-G formula based on Cr of 1.43).  Assessment: 58 y/o F with recent admission at [facility] where she had a complicated stay including MI>>cath with coronary artery dissection>> emergency bypass, VDFR>>trach, AFR>>dialysis, and DVT. Admitted 06/22/2014 to [facility] with SOB. Multiple decub. Ulcers.  PMH: recent CABG (2nd aortic dissection post cath), CAD, HF, hypothryoid, COPD,   AntiCoag: Coum-Rx: recent RUE DVT: INR is 1.82 down again (warfarin PTA dose: 4mg /day, LD 10/15). Hgb only 8.4.  ID: Abx d/c'd 10/17. Afeb, WBC 8.8, CT with patchy airspace disease.  10/16 vanc> 10/17 10/16 cefepime> 10/17 10/16 BCx x 2 > Propionibacterium x 1  Cardiovascular: Hx MI, CHF 40-45%, CABG - BP 100/57, HR 86 on ASA81, metoprolol, IV Lasix. Monitor hypotension  Endocrinology: Synthroid  Gastrointestinal / Nutritio: po PPI. Hypoalbumin. BMI 47.  Neurology: Scheduled Xanax  Nephrology: CKD-3. Scr 1.43 down, CrCl ~ 45. Required HD at recent admission at [facility], with temp HD cath still in place  Pulmonary: RA  Hematology / Oncology: Hgb 8.4, Pltc wnl (stable)  PTA Medication Issues: resumed appropriately  Best Practices: warfarin, po Ptx   Goal of Therapy:  INR 2-3 Monitor platelets by anticoagulation protocol: Yes   Plan:  Repeat Coumadin 7.5mg  po x 1 tonight Daily INR Recommend home on 5mg   daily   Bren Borys S. Alford Highland, PharmD, BCPS Clinical Staff Pharmacist Pager [phone]  Eilene Ghazi Stillinger 06/29/2014,9:58 AM

## 2014-06-29 NOTE — Progress Notes (Signed)
Discharge instructions given. Pt verbalized understanding and all questions were answered.  

## 2014-06-29 NOTE — Progress Notes (Signed)
Physical Therapy Treatment Patient Details Name: Laura Lane MRN: [id number] DOB: 1956/03/18 Today's Date: 03-Jul-2014    History of Present Illness Pt. admitted 06/22/14 with SOB, hypoxemia, acute respiratory failure due to systolic HF/pulmonary edema.  Pt. has lengthy     PT Comments    Pt making steady progress with therapy today. O2 sats stayed above 95% on RA with short distance gait. Used oxygen with exercises.   Follow Up Recommendations  Home health PT;Supervision for mobility/OOB     Equipment Recommendations  None recommended by PT       Precautions / Restrictions Precautions Precautions: Fall Restrictions Weight Bearing Restrictions: No    Mobility  Bed Mobility               General bed mobility comments: not assessed-pt in recliner before and after session today  Transfers Overall transfer level: Needs assistance Equipment used: Rolling walker (2 wheeled) Transfers: Sit to/from Stand Sit to Stand: Supervision         General transfer comment: cues for hand placment with transfers.  Ambulation/Gait Ambulation/Gait assistance: Supervision Ambulation Distance (Feet): 30 Feet Assistive device: Rolling walker (2 wheeled) Gait Pattern/deviations: Step-through pattern;Decreased stride length;Trunk flexed;Wide base of support Gait velocity: decreased Gait velocity interpretation: Below normal speed for age/gender General Gait Details: cues for posture and walker position with gaiit. SaO2 >/= 96% RA with gait.   Stairs            Wheelchair Mobility    Modified Rankin (Stroke Patients Only)          Cognition Arousal/Alertness: Awake/alert Behavior During Therapy: WFL for tasks assessed/performed Overall Cognitive Status: Within Functional Limits for tasks assessed                      Exercises General Exercises - Lower Extremity Ankle Circles/Pumps: AROM;Strengthening;10 reps;Seated Long Arc Quad: AROM;Strengthening;10  reps;Seated Hip Flexion/Marching: AROM;Strengthening;10 reps;Seated Heel Raises: AROM;Strengthening;10 reps;Standing Mini-Sqauts: AROM;Strengthening;10 reps;Standing        Pertinent Vitals/Pain Pain Assessment: No/denies pain     PT Goals (current goals can now be found in the care plan section) Acute Rehab PT Goals Patient Stated Goal: home with home health and family PT Goal Formulation: With patient Time For Goal Achievement: 07/09/14 Potential to Achieve Goals: Good Progress towards PT goals: Progressing toward goals    Frequency  Min 3X/week    PT Plan Current plan remains appropriate       End of Session Equipment Utilized During Treatment: Gait belt Activity Tolerance: Patient tolerated treatment well Patient left: in chair;with call bell/phone within reach     Time: 1113-1129 PT Time Calculation (min): 16 min  Charges:  $Therapeutic Exercise: 8-22 mins                    G Codes:      Willow Ora 03-Jul-2014, 1:44 PM  Willow Ora, PTA Office- [phone]

## 2014-06-29 NOTE — Progress Notes (Signed)
During ambulation O2 sat was between 80-85%, no SOB. Patient was placed back  on 2L oxygen, sitting O2 sat was 95%. Will continue to monitor patient

## 2014-06-29 NOTE — Progress Notes (Signed)
Foley catheter was DC at 10:50am. Pt is due to void at 4:00pm. Will continue to monitor.

## 2014-06-29 NOTE — Progress Notes (Signed)
SATURATION QUALIFICATIONS: (This note is used to comply with regulatory documentation for home oxygen)  Patient Saturations on Room Air at Rest = 95%  Patient Saturations on Room Air while Ambulating = 80-85%  Patient Saturations on 2 Liters of oxygen while Ambulating = 95%

## 2014-07-07 ENCOUNTER — Encounter: Payer: Self-pay | Admitting: Cardiovascular Disease

## 2014-07-07 ENCOUNTER — Ambulatory Visit: Payer: BC Managed Care – PPO | Admitting: Cardiovascular Disease

## 2014-07-17 ENCOUNTER — Ambulatory Visit (INDEPENDENT_AMBULATORY_CARE_PROVIDER_SITE_OTHER): Payer: BC Managed Care – PPO | Admitting: Cardiovascular Disease

## 2014-07-17 ENCOUNTER — Encounter: Payer: Self-pay | Admitting: Cardiovascular Disease

## 2014-07-17 VITALS — BP 93/61 | HR 87 | Ht 59.0 in | Wt 215.0 lb

## 2014-07-17 DIAGNOSIS — Z87898 Personal history of other specified conditions: Secondary | ICD-10-CM

## 2014-07-17 DIAGNOSIS — I38 Endocarditis, valve unspecified: Secondary | ICD-10-CM

## 2014-07-17 DIAGNOSIS — Z9289 Personal history of other medical treatment: Secondary | ICD-10-CM

## 2014-07-17 DIAGNOSIS — J438 Other emphysema: Secondary | ICD-10-CM

## 2014-07-17 DIAGNOSIS — Z87891 Personal history of nicotine dependence: Secondary | ICD-10-CM

## 2014-07-17 DIAGNOSIS — E785 Hyperlipidemia, unspecified: Secondary | ICD-10-CM

## 2014-07-17 DIAGNOSIS — I25810 Atherosclerosis of coronary artery bypass graft(s) without angina pectoris: Secondary | ICD-10-CM

## 2014-07-17 DIAGNOSIS — I10 Essential (primary) hypertension: Secondary | ICD-10-CM

## 2014-07-17 DIAGNOSIS — I82409 Acute embolism and thrombosis of unspecified deep veins of unspecified lower extremity: Secondary | ICD-10-CM

## 2014-07-17 DIAGNOSIS — L89150 Pressure ulcer of sacral region, unstageable: Secondary | ICD-10-CM

## 2014-07-17 MED ORDER — ATORVASTATIN CALCIUM 20 MG PO TABS: 20.0000 mg | ORAL_TABLET | Freq: Every day | ORAL | Status: DC

## 2014-07-17 NOTE — Patient Instructions (Signed)
   Begin Lipitor 20mg  daily - new sent to pharm Continue all other medications.   Labs for liver function test - due in 6 weeks Labs for lipids - due in 3 months  Will send reminder in the mail for the above labs.   Office will contact with results via phone or letter.   Follow up in  3-4 months

## 2014-07-17 NOTE — Addendum Note (Signed)
Addended by: Kate Sable A on: 07/17/2014 03:24 PM   Modules accepted: Level of Service

## 2014-07-17 NOTE — Progress Notes (Addendum)
Patient ID: KHARTER SESTAK, female   DOB: 1955-10-25, 58 y.o.   MRN: [id number]      SUBJECTIVE: The patient is a 58 year old woman who I am meeting for the first time. She has a history of coronary artery disease and CABG (2-vessel, 02/2014), right upper extremity DVT, mitral regurgitation, anemia, chronic kidney disease stage III, chronic systolic heart failure, morbid obesity, tracheal stenosis, former h/o tobacco abuse, sacral ulcer, and hypothyroidism. She was hospitalized for acute hypoxic respiratory failure in October 2015 deemed secondary to acute systolic heart failure. She reportedly underwent an echocardiogram on 03/14/14 which demonstrated normal left ventricular systolic function, LVEF 93-90%, with lateral wall hypokinesis, moderate mitral and mild to moderate tricuspid regurgitaiton. She very seldom experiences a fleeting chest pain which only lasts seconds. She has chronic dyspnea due to COPD which has not gotten any worse.  She goes to the wound care center in [place] for treatment of her sacral decubitus ulcer. She quit smoking in June 2015. She is here with her best friend.   Review of Systems: As per "subjective", otherwise negative.  No Known Allergies  Current Outpatient Prescriptions  Medication Sig Dispense Refill  . ALPRAZolam (XANAX) 1 MG tablet Take 1 mg by mouth 2 (two) times daily.    Marland Kitchen aspirin EC 81 MG tablet Take 81 mg by mouth daily.    . furosemide (LASIX) 40 MG tablet Take 40 mg by mouth daily.    Marland Kitchen levothyroxine (SYNTHROID, LEVOTHROID) 125 MCG tablet Take 125 mcg by mouth daily before breakfast.    . lisinopril (PRINIVIL,ZESTRIL) 2.5 MG tablet Take 1 tablet (2.5 mg total) by mouth daily. 30 tablet 0  . metoprolol tartrate (LOPRESSOR) 25 MG tablet Take 12.5 mg by mouth 2 (two) times daily.    Marland Kitchen morphine (MSIR) 15 MG tablet Take 15 mg by mouth every 4 (four) hours as needed for severe pain.    . pantoprazole (PROTONIX) 40 MG tablet Take 40 mg by mouth daily.      . polyethylene glycol (MIRALAX / GLYCOLAX) packet Take 17 g by mouth daily as needed for mild constipation or moderate constipation. 14 each 0  . warfarin (COUMADIN) 4 MG tablet Take 4 mg by mouth daily.     No current facility-administered medications for this visit.    Past Medical History  Diagnosis Date  . COPD (chronic obstructive pulmonary disease)   . MI (myocardial infarction)   . CHF (congestive heart failure)   . Hx of CABG   . Renal disorder   . Thyroid disease   . Hypothyroid     Past Surgical History  Procedure Laterality Date  . Insertion of dialysis catheter    . Cardiac surgery    . Tracheostomy    . Coronary artery bypass graft      History   Social History  . Marital Status: Married    Spouse Name: N/A    Number of Children: N/A  . Years of Education: N/A   Occupational History  . Not on file.   Social History Main Topics  . Smoking status: Former Smoker    Quit date: 02/06/2014  . Smokeless tobacco: Never Used  . Alcohol Use: No  . Drug Use: No  . Sexual Activity: Not on file   Other Topics Concern  . Not on file   Social History Narrative  . No narrative on file    BP 93/61  Pulse 87 pO2 99% Weight 215 lb (97.523 kg) Height 4'  11" (1.499 m)    PHYSICAL EXAM General: NAD, obese HEENT: Normal. Neck: No JVD, no thyromegaly. Lungs: Diminished b/l but without rales or wheezes. CV: Nondisplaced PMI.  Regular rate and rhythm, normal S1/S2, no S3/S4, no murmur. 1+ pitting pretibial and periankle edema.  Right leg bandaged.  Abdomen: Soft, nontender, no hepatosplenomegaly, no distention.  Neurologic: Alert and oriented x 3.  Psych: Normal affect. Skin: Large sacral ulcer (bandaged). Musculoskeletal: No gross deformities. Extremities: No clubbing or cyanosis.   ECG: Most recent ECG reviewed.      ASSESSMENT AND PLAN: 1. CAD/CABG: Stable ischemic heart disease. On ASA, ACEI and beta blocker. Not on statin therapy. I will start  Lipitor 20 mg and check LFT's in 6 weeks and lipids in 6 months. 2. Valvular heart disease: Moderate MR and mild to moderate TR. No evidence of heart failure. Continue to monitor. 3. Essential HTN: Low normal on current therapy which includes low dose ACEI and beta blocker. No changes. 4. Hyperlipidemia: Not on statin therapy. I will start Lipitor 20 mg and check LFT's in 6 weeks and lipids in 6 months. 5. DVT: On warfarin. Will likely require therapy for at least 6 months total. 6. Sacral decubitus ulcer: Goes for treatment at wound care center in [place]. 7. COPD: On oxygen therapy. Quit tobacco in 02/2014.  Dispo: f/u 3-4 months.    Kate Sable, M.D., F.A.C.C.

## 2014-09-14 ENCOUNTER — Encounter (HOSPITAL_COMMUNITY): Payer: BC Managed Care – PPO

## 2014-10-04 ENCOUNTER — Telehealth: Payer: Self-pay | Admitting: *Deleted

## 2014-10-04 ENCOUNTER — Encounter: Payer: Self-pay | Admitting: *Deleted

## 2014-10-04 NOTE — Telephone Encounter (Signed)
Liver function labs not done yet.  Patient stated that Dr. Sherrie Sport will manage her cholesterol.

## 2014-11-15 ENCOUNTER — Ambulatory Visit: Payer: BC Managed Care – PPO | Admitting: Cardiovascular Disease

## 2014-12-06 ENCOUNTER — Ambulatory Visit: Payer: Self-pay | Admitting: Cardiovascular Disease

## 2018-05-12 DIAGNOSIS — Z6836 Body mass index (BMI) 36.0-36.9, adult: Secondary | ICD-10-CM | POA: Diagnosis not present

## 2018-05-12 DIAGNOSIS — E119 Type 2 diabetes mellitus without complications: Secondary | ICD-10-CM | POA: Diagnosis not present

## 2018-05-12 DIAGNOSIS — E038 Other specified hypothyroidism: Secondary | ICD-10-CM | POA: Diagnosis not present

## 2018-05-12 DIAGNOSIS — Z0000 Encounter for general adult medical examination without abnormal findings: Secondary | ICD-10-CM | POA: Diagnosis not present

## 2018-05-12 DIAGNOSIS — I5022 Chronic systolic (congestive) heart failure: Secondary | ICD-10-CM | POA: Diagnosis not present

## 2018-05-12 DIAGNOSIS — E782 Mixed hyperlipidemia: Secondary | ICD-10-CM | POA: Diagnosis not present

## 2018-05-28 DIAGNOSIS — Z1231 Encounter for screening mammogram for malignant neoplasm of breast: Secondary | ICD-10-CM | POA: Diagnosis not present

## 2018-08-11 DIAGNOSIS — E782 Mixed hyperlipidemia: Secondary | ICD-10-CM | POA: Diagnosis not present

## 2018-08-11 DIAGNOSIS — E038 Other specified hypothyroidism: Secondary | ICD-10-CM | POA: Diagnosis not present

## 2018-08-11 DIAGNOSIS — N182 Chronic kidney disease, stage 2 (mild): Secondary | ICD-10-CM | POA: Diagnosis not present

## 2018-08-11 DIAGNOSIS — I10 Essential (primary) hypertension: Secondary | ICD-10-CM | POA: Diagnosis not present

## 2018-11-15 DIAGNOSIS — E038 Other specified hypothyroidism: Secondary | ICD-10-CM | POA: Diagnosis not present

## 2018-11-15 DIAGNOSIS — E782 Mixed hyperlipidemia: Secondary | ICD-10-CM | POA: Diagnosis not present

## 2018-11-15 DIAGNOSIS — N182 Chronic kidney disease, stage 2 (mild): Secondary | ICD-10-CM | POA: Diagnosis not present

## 2018-11-15 DIAGNOSIS — I10 Essential (primary) hypertension: Secondary | ICD-10-CM | POA: Diagnosis not present

## 2018-11-28 DIAGNOSIS — I509 Heart failure, unspecified: Secondary | ICD-10-CM | POA: Diagnosis not present

## 2018-11-28 DIAGNOSIS — G7281 Critical illness myopathy: Secondary | ICD-10-CM | POA: Diagnosis not present

## 2018-12-29 DIAGNOSIS — I509 Heart failure, unspecified: Secondary | ICD-10-CM | POA: Diagnosis not present

## 2018-12-29 DIAGNOSIS — G7281 Critical illness myopathy: Secondary | ICD-10-CM | POA: Diagnosis not present

## 2019-01-28 DIAGNOSIS — G7281 Critical illness myopathy: Secondary | ICD-10-CM | POA: Diagnosis not present

## 2019-01-28 DIAGNOSIS — I509 Heart failure, unspecified: Secondary | ICD-10-CM | POA: Diagnosis not present

## 2019-02-17 DIAGNOSIS — E1121 Type 2 diabetes mellitus with diabetic nephropathy: Secondary | ICD-10-CM | POA: Diagnosis not present

## 2019-02-17 DIAGNOSIS — E038 Other specified hypothyroidism: Secondary | ICD-10-CM | POA: Diagnosis not present

## 2019-02-17 DIAGNOSIS — I10 Essential (primary) hypertension: Secondary | ICD-10-CM | POA: Diagnosis not present

## 2019-02-17 DIAGNOSIS — E782 Mixed hyperlipidemia: Secondary | ICD-10-CM | POA: Diagnosis not present

## 2019-02-28 DIAGNOSIS — I509 Heart failure, unspecified: Secondary | ICD-10-CM | POA: Diagnosis not present

## 2019-02-28 DIAGNOSIS — G7281 Critical illness myopathy: Secondary | ICD-10-CM | POA: Diagnosis not present

## 2019-05-31 DIAGNOSIS — E782 Mixed hyperlipidemia: Secondary | ICD-10-CM | POA: Diagnosis not present

## 2019-05-31 DIAGNOSIS — N182 Chronic kidney disease, stage 2 (mild): Secondary | ICD-10-CM | POA: Diagnosis not present

## 2019-05-31 DIAGNOSIS — Z6838 Body mass index (BMI) 38.0-38.9, adult: Secondary | ICD-10-CM | POA: Diagnosis not present

## 2019-05-31 DIAGNOSIS — E038 Other specified hypothyroidism: Secondary | ICD-10-CM | POA: Diagnosis not present

## 2019-05-31 DIAGNOSIS — Z0000 Encounter for general adult medical examination without abnormal findings: Secondary | ICD-10-CM | POA: Diagnosis not present

## 2019-05-31 DIAGNOSIS — E1121 Type 2 diabetes mellitus with diabetic nephropathy: Secondary | ICD-10-CM | POA: Diagnosis not present

## 2019-08-02 DIAGNOSIS — M85851 Other specified disorders of bone density and structure, right thigh: Secondary | ICD-10-CM | POA: Diagnosis not present

## 2019-08-02 DIAGNOSIS — M8588 Other specified disorders of bone density and structure, other site: Secondary | ICD-10-CM | POA: Diagnosis not present

## 2019-08-02 DIAGNOSIS — M810 Age-related osteoporosis without current pathological fracture: Secondary | ICD-10-CM | POA: Diagnosis not present

## 2019-08-23 DIAGNOSIS — K8020 Calculus of gallbladder without cholecystitis without obstruction: Secondary | ICD-10-CM | POA: Diagnosis not present

## 2019-08-23 DIAGNOSIS — S37012A Minor contusion of left kidney, initial encounter: Secondary | ICD-10-CM | POA: Diagnosis not present

## 2019-08-23 DIAGNOSIS — K449 Diaphragmatic hernia without obstruction or gangrene: Secondary | ICD-10-CM | POA: Diagnosis not present

## 2019-08-23 DIAGNOSIS — D72829 Elevated white blood cell count, unspecified: Secondary | ICD-10-CM | POA: Diagnosis not present

## 2019-08-23 DIAGNOSIS — R1012 Left upper quadrant pain: Secondary | ICD-10-CM | POA: Diagnosis not present

## 2019-08-23 DIAGNOSIS — N2889 Other specified disorders of kidney and ureter: Secondary | ICD-10-CM | POA: Diagnosis not present

## 2019-08-23 DIAGNOSIS — R7989 Other specified abnormal findings of blood chemistry: Secondary | ICD-10-CM | POA: Diagnosis not present

## 2019-08-24 DIAGNOSIS — I509 Heart failure, unspecified: Secondary | ICD-10-CM | POA: Diagnosis not present

## 2019-08-24 DIAGNOSIS — K449 Diaphragmatic hernia without obstruction or gangrene: Secondary | ICD-10-CM | POA: Diagnosis not present

## 2019-08-24 DIAGNOSIS — I959 Hypotension, unspecified: Secondary | ICD-10-CM | POA: Diagnosis not present

## 2019-08-24 DIAGNOSIS — X58XXXA Exposure to other specified factors, initial encounter: Secondary | ICD-10-CM | POA: Diagnosis not present

## 2019-08-24 DIAGNOSIS — N2889 Other specified disorders of kidney and ureter: Secondary | ICD-10-CM | POA: Diagnosis not present

## 2019-08-24 DIAGNOSIS — R7989 Other specified abnormal findings of blood chemistry: Secondary | ICD-10-CM | POA: Diagnosis not present

## 2019-08-24 DIAGNOSIS — N183 Chronic kidney disease, stage 3 unspecified: Secondary | ICD-10-CM | POA: Diagnosis not present

## 2019-08-24 DIAGNOSIS — R1012 Left upper quadrant pain: Secondary | ICD-10-CM | POA: Diagnosis not present

## 2019-08-24 DIAGNOSIS — K8020 Calculus of gallbladder without cholecystitis without obstruction: Secondary | ICD-10-CM | POA: Diagnosis not present

## 2019-08-24 DIAGNOSIS — F411 Generalized anxiety disorder: Secondary | ICD-10-CM | POA: Diagnosis not present

## 2019-08-24 DIAGNOSIS — I2510 Atherosclerotic heart disease of native coronary artery without angina pectoris: Secondary | ICD-10-CM | POA: Diagnosis not present

## 2019-08-24 DIAGNOSIS — D72829 Elevated white blood cell count, unspecified: Secondary | ICD-10-CM | POA: Diagnosis not present

## 2019-08-24 DIAGNOSIS — I130 Hypertensive heart and chronic kidney disease with heart failure and stage 1 through stage 4 chronic kidney disease, or unspecified chronic kidney disease: Secondary | ICD-10-CM | POA: Diagnosis not present

## 2019-08-24 DIAGNOSIS — Z7982 Long term (current) use of aspirin: Secondary | ICD-10-CM | POA: Diagnosis not present

## 2019-08-24 DIAGNOSIS — Z20828 Contact with and (suspected) exposure to other viral communicable diseases: Secondary | ICD-10-CM | POA: Diagnosis not present

## 2019-08-24 DIAGNOSIS — S37012A Minor contusion of left kidney, initial encounter: Secondary | ICD-10-CM | POA: Diagnosis not present

## 2019-08-24 DIAGNOSIS — D649 Anemia, unspecified: Secondary | ICD-10-CM | POA: Diagnosis not present

## 2019-08-24 DIAGNOSIS — E039 Hypothyroidism, unspecified: Secondary | ICD-10-CM | POA: Diagnosis not present

## 2019-08-24 DIAGNOSIS — E785 Hyperlipidemia, unspecified: Secondary | ICD-10-CM | POA: Diagnosis not present

## 2019-08-25 DIAGNOSIS — X58XXXA Exposure to other specified factors, initial encounter: Secondary | ICD-10-CM | POA: Diagnosis not present

## 2019-08-25 DIAGNOSIS — I130 Hypertensive heart and chronic kidney disease with heart failure and stage 1 through stage 4 chronic kidney disease, or unspecified chronic kidney disease: Secondary | ICD-10-CM | POA: Diagnosis not present

## 2019-08-25 DIAGNOSIS — S37012A Minor contusion of left kidney, initial encounter: Secondary | ICD-10-CM | POA: Diagnosis not present

## 2019-08-25 DIAGNOSIS — D649 Anemia, unspecified: Secondary | ICD-10-CM | POA: Diagnosis not present

## 2019-08-26 DIAGNOSIS — S37012A Minor contusion of left kidney, initial encounter: Secondary | ICD-10-CM | POA: Diagnosis not present

## 2019-08-26 DIAGNOSIS — D649 Anemia, unspecified: Secondary | ICD-10-CM | POA: Diagnosis not present

## 2019-08-26 DIAGNOSIS — I130 Hypertensive heart and chronic kidney disease with heart failure and stage 1 through stage 4 chronic kidney disease, or unspecified chronic kidney disease: Secondary | ICD-10-CM | POA: Diagnosis not present

## 2019-08-26 DIAGNOSIS — X58XXXA Exposure to other specified factors, initial encounter: Secondary | ICD-10-CM | POA: Diagnosis not present

## 2019-08-30 DIAGNOSIS — E1121 Type 2 diabetes mellitus with diabetic nephropathy: Secondary | ICD-10-CM | POA: Diagnosis not present

## 2019-08-30 DIAGNOSIS — I952 Hypotension due to drugs: Secondary | ICD-10-CM | POA: Diagnosis not present

## 2019-08-30 DIAGNOSIS — S37092A Other injury of left kidney, initial encounter: Secondary | ICD-10-CM | POA: Diagnosis not present

## 2019-08-30 DIAGNOSIS — Z6841 Body Mass Index (BMI) 40.0 and over, adult: Secondary | ICD-10-CM | POA: Diagnosis not present

## 2019-08-30 DIAGNOSIS — N182 Chronic kidney disease, stage 2 (mild): Secondary | ICD-10-CM | POA: Diagnosis not present

## 2019-09-21 ENCOUNTER — Ambulatory Visit (INDEPENDENT_AMBULATORY_CARE_PROVIDER_SITE_OTHER): Payer: BLUE CROSS/BLUE SHIELD | Admitting: Urology

## 2019-09-21 ENCOUNTER — Other Ambulatory Visit: Payer: Self-pay | Admitting: Urology

## 2019-09-21 ENCOUNTER — Encounter: Payer: Self-pay | Admitting: Urology

## 2019-09-21 ENCOUNTER — Other Ambulatory Visit: Payer: Self-pay

## 2019-09-21 VITALS — BP 147/86 | HR 69 | Temp 95.7°F | Ht 59.0 in | Wt 206.0 lb

## 2019-09-21 DIAGNOSIS — N2889 Other specified disorders of kidney and ureter: Secondary | ICD-10-CM | POA: Diagnosis not present

## 2019-09-21 DIAGNOSIS — S37012A Minor contusion of left kidney, initial encounter: Secondary | ICD-10-CM | POA: Insufficient documentation

## 2019-09-21 LAB — POCT URINALYSIS DIPSTICK
Bilirubin, UA: NEGATIVE
Glucose, UA: NEGATIVE
Ketones, UA: NEGATIVE
Nitrite, UA: NEGATIVE
Protein, UA: POSITIVE — AB
Spec Grav, UA: 1.02 (ref 1.010–1.025)
Urobilinogen, UA: NEGATIVE E.U./dL — AB
pH, UA: 7.5 (ref 5.0–8.0)

## 2019-09-21 NOTE — Progress Notes (Signed)
09/21/2019 11:38 AM   Laura Lane 1955-12-13 [id number]  Referring provider: Neale Burly, MD [address] [place],  [place] [id number]  Left flank pain  HPI: Laura Lane is a 64yo here for evaluation on a left renal hematoma. She has a hx of MI in 2015. She is on ASA. Coumadin is listed on her med list but she is not taking it. She presented on 12/16 to the ER with flank pain and underwent CT which showed a large left supcapsular hematoma. She has intermittent dull, mild left flank pain. No other associated symptoms. NO exacerbating/alleviaitng events. No recent BMP   PMH: Past Medical History:  Diagnosis Date  . CHF (congestive heart failure)   . COPD (chronic obstructive pulmonary disease)   . Hx of CABG   . Hypothyroid   . MI (myocardial infarction)   . Renal disorder   . Thyroid disease     Surgical History: Past Surgical History:  Procedure Laterality Date  . CARDIAC SURGERY    . CORONARY ARTERY BYPASS GRAFT    . INSERTION OF DIALYSIS CATHETER    . TRACHEOSTOMY      Home Medications:  Allergies as of 09/21/2019   No Known Allergies     Medication List       Accurate as of September 21, 2019 11:38 AM. If you have any questions, ask your nurse or doctor.        ALPRAZolam 1 MG tablet Commonly known as: XANAX Take 1 mg by mouth 2 (two) times daily.   aspirin EC 81 MG tablet Take 81 mg by mouth daily.   atorvastatin 20 MG tablet Commonly known as: LIPITOR Take 1 tablet (20 mg total) by mouth daily.   furosemide 40 MG tablet Commonly known as: LASIX Take 40 mg by mouth daily.   levothyroxine 125 MCG tablet Commonly known as: SYNTHROID Take 125 mcg by mouth daily before breakfast.   lisinopril 2.5 MG tablet Commonly known as: ZESTRIL Take 1 tablet (2.5 mg total) by mouth daily.   metoprolol tartrate 25 MG tablet Commonly known as: LOPRESSOR Take 12.5 mg by mouth 2 (two) times daily.   morphine 15 MG tablet Commonly known as: MSIR  Take 15 mg by mouth every 4 (four) hours as needed for severe pain.   pantoprazole 40 MG tablet Commonly known as: PROTONIX Take 40 mg by mouth daily.   polyethylene glycol 17 g packet Commonly known as: MIRALAX / GLYCOLAX Take 17 g by mouth daily as needed for mild constipation or moderate constipation.   warfarin 4 MG tablet Commonly known as: COUMADIN Take 4 mg by mouth daily.       Allergies: No Known Allergies  Family History: Family History  Problem Relation Age of Onset  . CAD Neg Hx     Social History:  reports that she quit smoking about 5 years ago. She started smoking about 47 years ago. She has never used smokeless tobacco. She reports that she does not drink alcohol or use drugs.  ROS:                                        Physical Exam: Temp (!) 95.7 F (35.4 C)   Constitutional:  Alert and oriented, No acute distress. HEENT: [place] AT, moist mucus membranes.  Trachea midline, no masses. Cardiovascular: No clubbing, cyanosis, or edema. Respiratory: Normal respiratory  effort, no increased work of breathing. GI: Abdomen is soft, nontender, nondistended, no abdominal masses GU: No CVA tenderness Lymph: No cervical or inguinal lymphadenopathy. Skin: No rashes, bruises or suspicious lesions. Neurologic: Grossly intact, no focal deficits, moving all 4 extremities. Psychiatric: Normal mood and affect.  Laboratory Data: Lab Results  Component Value Date   WBC 8.8 06/25/2014   HGB 8.4 (L) 06/25/2014   HCT 26.8 (L) 06/25/2014   MCV 97.5 06/25/2014   PLT 382 06/25/2014    Lab Results  Component Value Date   CREATININE 1.43 (H) 06/27/2014    No results found for: PSA  No results found for: TESTOSTERONE  Lab Results  Component Value Date   HGBA1C 6.0 (H) 06/23/2014    Urinalysis    Component Value Date/Time   COLORURINE YELLOW 06/23/2014 0145   APPEARANCEUR CLEAR 06/23/2014 0145   LABSPEC 1.009 06/23/2014 0145   PHURINE  5.0 06/23/2014 0145   GLUCOSEU NEGATIVE 06/23/2014 0145   HGBUR NEGATIVE 06/23/2014 0145   BILIRUBINUR NEGATIVE 06/23/2014 0145   KETONESUR NEGATIVE 06/23/2014 0145   PROTEINUR NEGATIVE 06/23/2014 0145   UROBILINOGEN 0.2 06/23/2014 0145   NITRITE NEGATIVE 06/23/2014 0145   LEUKOCYTESUR NEGATIVE 06/23/2014 0145    No results found for: LABMICR, [place], RBCUA, LABEPIT, MUCUS, BACTERIA  Pertinent Imaging: CT abd/pelvis wo contrast, MRI abd wo contrast No results found for this or any previous visit. No results found for this or any previous visit. No results found for this or any previous visit. No results found for this or any previous visit. No results found for this or any previous visit. No results found for this or any previous visit. No results found for this or any previous visit. No results found for this or any previous visit.  Assessment & Plan:    1. Renal hematoma, left, initial encounter -I discussed the concern for malignancy. We will obtain BMP and repeat MRI in 2-3 weeks  No follow-ups on file.  Nicolette Bang, MD  [facility] [place]

## 2019-09-21 NOTE — Patient Instructions (Signed)
Renal Mass  A renal mass is a growth in the kidney. A renal mass may be found while performing an MRI, CT scan, or ultrasound for other problems of the abdomen. Certain types of cancers, infections, or injuries can cause a renal mass. A renal mass that is cancerous (malignant) may grow or spread quickly. Others are harmless (benign). What are common types of renal masses? Renal masses include:  Tumors. These may be cancerous (malignant) or noncancerous (benign). ? The most common type of kidney cancer is renal cell carcinoma. ? The most common benign tumors of the kidney include renal adenomas, oncocytomas, and angiomyolipoma (AML).  Cysts. These are fluid-filled sacs that form on or in the kidney. ? It is not always known what causes a cyst to develop in or on the kidney. ? Most kidney cysts do not cause symptoms and do not need to be treated. What type of testing might I need? Your health care provider may recommend that you have tests to diagnose the cause of your renal mass. The following tests may be done if a renal mass is found:  Physical exam.  Blood tests.  Urine tests.  Imaging tests, such as ultrasound, CT scan, or MRI.  Biopsy. This is a small sample that is removed from the renal mass and tested in a lab. The exact tests and how often they are done will depend on:  The size and appearance of the renal mass.  Risk factors or medical conditions that increase your risk for problems.  Any symptoms associated with the renal mass, or concerns that you have about it. Tests and physical exams may be done once, or they may be done regularly for a period of time. Tests and exams that are done regularly will help monitor whether the mass is growing and beginning to cause problems. What are common treatments for renal masses? Treatment is not always needed for this condition. Your health care provider may recommend careful monitoring (watchful waiting) and regular tests and exams.  Treatment will depend on the cause of the mass. Follow these instructions at home: What you need to do at home will depend on the cause of the mass. Follow the instructions that your health care provider gives to you. In general:  Take over-the-counter and prescription medicines only as told by your health care provider.  If you are prescribed an antibiotic medicine, take it as told by your health care provider. Do not stop taking the antibiotic even if you start to feel better.  Follow any restrictions that are given to you by your health care provider.  Keep all follow-up visits as told by your health care provider. This is important. ? You may need to see your health care provider once or twice a year to have CT scans and ultrasounds done. These tests will show if your renal mass has changed or grown bigger. Contact a health care provider if you:  Have pain in the side or back (flank pain).  Have a fever.  Feel full soon after eating.  Have pain or swelling in the abdomen.  Lose weight. Get help right away if:  Your pain gets worse.  There is blood in your urine.  You cannot urinate.  You have chest pain.  You have trouble breathing. Summary  A renal mass is a growth in the kidney. It may be cancerous (malignant) and grow or spread quickly, or it may be harmless (benign).  Renal masses may be found while performing   an MRI, CT scan, or ultrasound for other problems of the abdomen.  Your health care provider may recommend that you have tests to diagnose the cause of your renal mass. This may include a physical exam, blood tests, urine tests, imaging, or a biopsy.  Treatment is not always needed for this condition. Careful monitoring (watchful waiting) may be recommended. This information is not intended to replace advice given to you by your health care provider. Make sure you discuss any questions you have with your health care provider. Document Revised: 10/01/2017  Document Reviewed: 10/01/2017 Elsevier Patient Education  2020 [employer]vier Inc.  

## 2019-09-22 ENCOUNTER — Other Ambulatory Visit: Payer: Self-pay | Admitting: Urology

## 2019-09-22 LAB — BASIC METABOLIC PANEL WITH GFR
BUN/Creatinine Ratio: 12 (calc) (ref 6–22)
BUN: 14 mg/dL (ref 7–25)
CO2: 30 mmol/L (ref 20–32)
Calcium: 9.4 mg/dL (ref 8.6–10.4)
Chloride: 106 mmol/L (ref 98–110)
Creat: 1.21 mg/dL — ABNORMAL HIGH (ref 0.50–0.99)
GFR, Est African American: 55 mL/min/{1.73_m2} — ABNORMAL LOW (ref >=60)
GFR, Est Non African American: 48 mL/min/{1.73_m2} — ABNORMAL LOW (ref >=60)
Glucose, Bld: 97 mg/dL (ref 65–139)
Potassium: 4.3 mmol/L (ref 3.5–5.3)
Sodium: 142 mmol/L (ref 135–146)

## 2019-10-27 ENCOUNTER — Ambulatory Visit (HOSPITAL_COMMUNITY): Payer: BLUE CROSS/BLUE SHIELD

## 2019-11-02 ENCOUNTER — Ambulatory Visit: Payer: BLUE CROSS/BLUE SHIELD | Admitting: Urology

## 2019-11-11 ENCOUNTER — Ambulatory Visit (HOSPITAL_COMMUNITY)
Admission: RE | Admit: 2019-11-11 | Discharge: 2019-11-11 | Disposition: A | Discharge status: Home / Self Care | Payer: BLUE CROSS/BLUE SHIELD | Source: Ambulatory Visit | Attending: Urology | Admitting: Urology

## 2019-11-11 ENCOUNTER — Other Ambulatory Visit: Payer: Self-pay

## 2019-11-11 DIAGNOSIS — N2889 Other specified disorders of kidney and ureter: Secondary | ICD-10-CM | POA: Diagnosis not present

## 2019-11-11 DIAGNOSIS — K8020 Calculus of gallbladder without cholecystitis without obstruction: Secondary | ICD-10-CM | POA: Diagnosis not present

## 2019-11-11 LAB — POCT I-STAT CREATININE: Creatinine, Ser: 1.2 mg/dL — ABNORMAL HIGH (ref 0.44–1.00)

## 2019-11-11 MED ORDER — GADOBUTROL 1 MMOL/ML IV SOLN
10.0000 mL | Freq: Once | INTRAVENOUS | Status: AC | PRN
  Administered 2019-11-11: 10 mL via INTRAVENOUS

## 2019-11-15 ENCOUNTER — Telehealth: Payer: Self-pay

## 2019-11-15 NOTE — Telephone Encounter (Signed)
-----   Message from Cleon Gustin, MD sent at 11/14/2019 10:01 AM EST ----- MRI showed hematoma, no mass ----- Message ----- From: Dorisann Frames, RN Sent: 11/11/2019  11:12 AM EST To: Cleon Gustin, MD  Please review

## 2019-11-15 NOTE — Telephone Encounter (Signed)
Pt. Notified.

## 2019-11-28 DIAGNOSIS — S37092A Other injury of left kidney, initial encounter: Secondary | ICD-10-CM | POA: Diagnosis not present

## 2019-11-28 DIAGNOSIS — E1121 Type 2 diabetes mellitus with diabetic nephropathy: Secondary | ICD-10-CM | POA: Diagnosis not present

## 2019-11-28 DIAGNOSIS — I10 Essential (primary) hypertension: Secondary | ICD-10-CM | POA: Diagnosis not present

## 2019-11-28 DIAGNOSIS — I952 Hypotension due to drugs: Secondary | ICD-10-CM | POA: Diagnosis not present

## 2019-12-28 ENCOUNTER — Other Ambulatory Visit: Payer: Self-pay

## 2019-12-28 ENCOUNTER — Ambulatory Visit: Payer: BLUE CROSS/BLUE SHIELD | Admitting: Urology

## 2019-12-28 ENCOUNTER — Encounter: Payer: Self-pay | Admitting: Urology

## 2019-12-28 VITALS — BP 139/81 | HR 59 | Temp 98.1°F | Ht 59.0 in | Wt 208.0 lb

## 2019-12-28 DIAGNOSIS — N2889 Other specified disorders of kidney and ureter: Secondary | ICD-10-CM | POA: Diagnosis not present

## 2019-12-28 DIAGNOSIS — S37012A Minor contusion of left kidney, initial encounter: Secondary | ICD-10-CM

## 2019-12-28 NOTE — Patient Instructions (Signed)
Hematoma A hematoma is a collection of blood. A hematoma can happen:  Under the skin.  In an organ.  In a body space.  In a joint space.  In other tissues. The blood can thicken (clot) to form a lump that you can see and feel. The lump is often hard and may become sore and tender. The lump can be very small or very big. Most hematomas get better in a few days to weeks. However, some hematomas may be serious and need medical care. What are the causes? This condition is caused by:  An injury.  Blood that leaks under the skin.  Problems from surgeries.  Medical conditions that cause bleeding or bruising. What increases the risk? You are more likely to develop this condition if:  You are an older adult.  You use medicines that thin your blood. What are the signs or symptoms? Symptoms depend on where the hematoma is in your body.  If the hematoma is under the skin, there is: ? A firm lump on the body. ? Pain and tenderness in the area. ? Bruising. The skin above the lump may be blue, dark blue, purple-red, or yellowish.  If the hematoma is deep in the tissues or body spaces, there may be: ? Blood in the stomach. This may cause pain in the belly (abdomen), weakness, passing out (fainting), and shortness of breath. ? Blood in the head. This may cause a headache, weakness, trouble speaking or understanding speech, or passing out. How is this diagnosed? This condition is diagnosed based on:  Your medical history.  A physical exam.  Imaging tests, such as ultrasound or CT scan.  Blood tests. How is this treated? Treatment depends on the cause, size, and location of the hematoma. Treatment may include:  Doing nothing. Many hematomas go away on their own without treatment.  Surgery or close monitoring. This may be needed for large hematomas or hematomas that affect the body's organs.  Medicines. These may be given if a medical condition caused the hematoma. Follow these  instructions at home: Managing pain, stiffness, and swelling   If told, put ice on the area. ? Put ice in a plastic bag. ? Place a towel between your skin and the bag. ? Leave the ice on for 20 minutes, 2-3 times a day for the first two days.  If told, put heat on the affected area after putting ice on the area for two days. Use the heat source that your doctor tells you to use. This could be a moist heat pack or a heating pad. To do this: ? Place a towel between your skin and the heat source. ? Leave the heat on for 20-30 minutes. ? Remove the heat if your skin turns bright red. This is very important if you are unable to feel pain, heat, or cold. You may have a greater risk of getting burned.  Raise (elevate) the affected area above the level of your heart while you are sitting or lying down.  Wrap the affected area with an elastic bandage, if told by your doctor. Do not wrap the bandage too tightly.  If your hematoma is on a leg or foot and is painful, your doctor may give you crutches. Use them as told by your doctor. General instructions  Take over-the-counter and prescription medicines only as told by your doctor.  Keep all follow-up visits as told by your doctor. This is important. Contact a doctor if:  You have a fever.    The swelling or bruising gets worse.  You start to get more hematomas. Get help right away if:  Your pain gets worse.  Your pain is not getting better with medicine.  Your skin over the hematoma breaks or starts to bleed.  Your hematoma is in your chest or belly and you: ? Pass out. ? Feel weak. ? Become short of breath.  You have a hematoma on your scalp that is caused by a fall or injury, and you: ? Have a headache that gets worse. ? Have trouble speaking or understanding speech. ? Become less alert or you pass out. Summary  A hematoma is a collection of blood in any part of your body.  Most hematomas get better on their own in a few days  to weeks. Some may need medical care.  Follow instructions from your doctor about how to care for your hematoma.  Contact a doctor if the swelling or bruising gets worse, or if you are short of breath. This information is not intended to replace advice given to you by your health care provider. Make sure you discuss any questions you have with your health care provider. Document Revised: 01/28/2018 Document Reviewed: 01/28/2018 Elsevier Patient Education  2020 Elsevier Inc.  

## 2019-12-28 NOTE — Progress Notes (Signed)
12/28/2019 1:36 PM   BRISIA BILLING [birth year] [id number]  Referring provider: Neale Burly, MD [address] [place],  [place] [phone]  Left renal hematoma  HPI: Ms Katayama is a 64yo here for followup for a left renal hematoma. MRI shows improving left renal hematoma and no underlying suspicious renal mass. No flank pain. NO hematuria or dysuria.    PMH: Past Medical History:  Diagnosis Date  . CHF (congestive heart failure) ([place])   . COPD (chronic obstructive pulmonary disease) ([place])   . Hx of CABG   . Hypothyroid   . MI (myocardial infarction) ([place])   . Renal disorder   . Thyroid disease     Surgical History: Past Surgical History:  Procedure Laterality Date  . CARDIAC SURGERY    . CORONARY ARTERY BYPASS GRAFT    . INSERTION OF DIALYSIS CATHETER    . removal of cyst on labia       . TRACHEOSTOMY      Home Medications:  Allergies as of 12/28/2019      Reactions   Prednisone Itching   Zofran [ondansetron] Nausea And Vomiting, Other (See Comments)      Medication List       Accurate as of December 28, 2019  1:36 PM. If you have any questions, ask your nurse or doctor.        ALPRAZolam 1 MG tablet Commonly known as: XANAX Take 1 mg by mouth 2 (two) times daily.   aspirin EC 81 MG tablet Take 81 mg by mouth daily.   atorvastatin 10 MG tablet Commonly known as: LIPITOR Take 10 mg by mouth daily. What changed: Another medication with the same name was removed. Continue taking this medication, and follow the directions you see here. Changed by: Nicolette Bang, MD   calcium-vitamin D 250-100 MG-UNIT tablet Take 1 tablet by mouth 2 (two) times daily.   furosemide 40 MG tablet Commonly known as: LASIX Take 40 mg by mouth daily.   ibandronate 150 MG tablet Commonly known as: BONIVA Take 150 mg by mouth every 30 (thirty) days. Take in the morning with a full glass of water, on an empty stomach, and do not take anything else by mouth or lie down  for the next 30 min.   levothyroxine 125 MCG tablet Commonly known as: SYNTHROID Take 100 mcg by mouth daily before breakfast.   Euthyrox 100 MCG tablet Generic drug: levothyroxine Take 100 mcg by mouth daily.   lisinopril 2.5 MG tablet Commonly known as: ZESTRIL Take 1 tablet (2.5 mg total) by mouth daily.   metoprolol tartrate 25 MG tablet Commonly known as: LOPRESSOR Take 12.5 mg by mouth 2 (two) times daily.   morphine 15 MG tablet Commonly known as: MSIR Take 15 mg by mouth every 4 (four) hours as needed for severe pain.   pantoprazole 40 MG tablet Commonly known as: PROTONIX Take 40 mg by mouth daily.   polyethylene glycol 17 g packet Commonly known as: MIRALAX / GLYCOLAX Take 17 g by mouth daily as needed for mild constipation or moderate constipation.   ProAir RespiClick [id number] (90 Base) MCG/ACT Aepb Generic drug: Albuterol Sulfate Inhale into the lungs.   Tylenol 8 Hour 650 MG CR tablet Generic drug: acetaminophen Take 650 mg by mouth every 8 (eight) hours as needed for pain.   warfarin 4 MG tablet Commonly known as: COUMADIN Take 4 mg by mouth daily.       Allergies:  Allergies  Allergen  Reactions  . Prednisone Itching  . Zofran [Ondansetron] Nausea And Vomiting and Other (See Comments)    Family History: Family History  Problem Relation Age of Onset  . Lung disease Father   . Breast cancer Mother   . Kidney disease Mother   . CAD Neg Hx     Social History:  reports that she quit smoking about 5 years ago. She started smoking about 47 years ago. She has never used smokeless tobacco. She reports that she does not drink alcohol or use drugs.  ROS: All other review of systems were reviewed and are negative except what is noted above in HPI  Physical Exam: BP 139/81   Pulse (!) 59   Temp 98.1 F (36.7 C)   Ht 4\' 11"  (1.499 m)   Wt 208 lb (94.3 kg)   BMI 42.01 kg/m   Constitutional:  Alert and oriented, No acute distress. HEENT: [place] AT,  moist mucus membranes.  Trachea midline, no masses. Cardiovascular: No clubbing, cyanosis, or edema. Respiratory: Normal respiratory effort, no increased work of breathing. GI: Abdomen is soft, nontender, nondistended, no abdominal masses GU: No CVA tenderness.  Lymph: No cervical or inguinal lymphadenopathy. Skin: No rashes, bruises or suspicious lesions. Neurologic: Grossly intact, no focal deficits, moving all 4 extremities. Psychiatric: Normal mood and affect.  Laboratory Data: Lab Results  Component Value Date   WBC 8.8 06/25/2014   HGB 8.4 (L) 06/25/2014   HCT 26.8 (L) 06/25/2014   MCV 97.5 06/25/2014   PLT 382 06/25/2014    Lab Results  Component Value Date   CREATININE 1.20 (H) 11/11/2019    No results found for: PSA  No results found for: TESTOSTERONE  Lab Results  Component Value Date   HGBA1C 6.0 (H) 06/23/2014    Urinalysis    Component Value Date/Time   COLORURINE YELLOW 06/23/2014 0145   APPEARANCEUR CLEAR 06/23/2014 0145   LABSPEC 1.009 06/23/2014 0145   PHURINE 5.0 06/23/2014 0145   GLUCOSEU NEGATIVE 06/23/2014 0145   HGBUR NEGATIVE 06/23/2014 0145   BILIRUBINUR neg 09/21/2019 1153   KETONESUR NEGATIVE 06/23/2014 0145   PROTEINUR Positive (A) 09/21/2019 1153   PROTEINUR NEGATIVE 06/23/2014 0145   UROBILINOGEN negative (A) 09/21/2019 1153   UROBILINOGEN 0.2 06/23/2014 0145   NITRITE neg 09/21/2019 1153   NITRITE NEGATIVE 06/23/2014 0145   LEUKOCYTESUR Trace (A) 09/21/2019 1153    No results found for: LABMICR, WBCUA, RBCUA, LABEPIT, MUCUS, BACTERIA  Pertinent Imaging: MRI 3/5: Images reviewed and discussed with the patient.  No results found for this or any previous visit. No results found for this or any previous visit. No results found for this or any previous visit. No results found for this or any previous visit. No results found for this or any previous visit. No results found for this or any previous visit. No results found for this  or any previous visit. No results found for this or any previous visit.  Assessment & Plan:    1.  Renal hematoma, left, initial encounter -hematoma improving and MRI does not show any renal mass. I will have her followup in 6 months with a renal US   No follow-ups on file.  Nicolette Bang, MD  [facility] [place]

## 2019-12-28 NOTE — Progress Notes (Signed)
Urological Symptom Review  Patient is experiencing the following symptoms: Get up at night to urinate   Review of Systems  Gastrointestinal (upper)  : Negative for upper GI symptoms  Gastrointestinal (lower) : Constipation  Constitutional : Fatigue  Skin: Itching  Eyes: Blurred vision  Ear/Nose/Throat : Negative for Ear/Nose/Throat symptoms  Hematologic/Lymphatic: Bruise /Bleed Easily  Cardiovascular : Negative for cardiovascular symptoms  Respiratory : Negative for respiratory symptoms  Endocrine: Negative for endocrine symptoms  Musculoskeletal: Back pain Joint pain  Neurological: Headaches  Psychologic: Anxiety

## 2020-02-27 DIAGNOSIS — E038 Other specified hypothyroidism: Secondary | ICD-10-CM | POA: Diagnosis not present

## 2020-02-27 DIAGNOSIS — I10 Essential (primary) hypertension: Secondary | ICD-10-CM | POA: Diagnosis not present

## 2020-02-27 DIAGNOSIS — E1121 Type 2 diabetes mellitus with diabetic nephropathy: Secondary | ICD-10-CM | POA: Diagnosis not present

## 2020-02-27 DIAGNOSIS — I952 Hypotension due to drugs: Secondary | ICD-10-CM | POA: Diagnosis not present

## 2020-03-02 DIAGNOSIS — E1121 Type 2 diabetes mellitus with diabetic nephropathy: Secondary | ICD-10-CM | POA: Diagnosis not present

## 2020-03-02 DIAGNOSIS — I10 Essential (primary) hypertension: Secondary | ICD-10-CM | POA: Diagnosis not present

## 2020-03-02 DIAGNOSIS — I952 Hypotension due to drugs: Secondary | ICD-10-CM | POA: Diagnosis not present

## 2020-03-02 DIAGNOSIS — E038 Other specified hypothyroidism: Secondary | ICD-10-CM | POA: Diagnosis not present

## 2020-03-15 DIAGNOSIS — Z01818 Encounter for other preprocedural examination: Secondary | ICD-10-CM | POA: Diagnosis not present

## 2020-03-15 DIAGNOSIS — H2512 Age-related nuclear cataract, left eye: Secondary | ICD-10-CM | POA: Diagnosis not present

## 2020-03-15 DIAGNOSIS — H25812 Combined forms of age-related cataract, left eye: Secondary | ICD-10-CM | POA: Diagnosis not present

## 2020-03-29 DIAGNOSIS — H2511 Age-related nuclear cataract, right eye: Secondary | ICD-10-CM | POA: Diagnosis not present

## 2020-03-29 DIAGNOSIS — H25811 Combined forms of age-related cataract, right eye: Secondary | ICD-10-CM | POA: Diagnosis not present

## 2020-05-29 DIAGNOSIS — E7849 Other hyperlipidemia: Secondary | ICD-10-CM | POA: Diagnosis not present

## 2020-05-29 DIAGNOSIS — E1121 Type 2 diabetes mellitus with diabetic nephropathy: Secondary | ICD-10-CM | POA: Diagnosis not present

## 2020-05-29 DIAGNOSIS — E038 Other specified hypothyroidism: Secondary | ICD-10-CM | POA: Diagnosis not present

## 2020-05-29 DIAGNOSIS — I10 Essential (primary) hypertension: Secondary | ICD-10-CM | POA: Diagnosis not present

## 2020-06-27 ENCOUNTER — Other Ambulatory Visit: Payer: Self-pay

## 2020-06-27 ENCOUNTER — Ambulatory Visit (HOSPITAL_COMMUNITY)
Admission: RE | Admit: 2020-06-27 | Discharge: 2020-06-27 | Disposition: A | Discharge status: Home / Self Care | Payer: BLUE CROSS/BLUE SHIELD | Source: Ambulatory Visit | Attending: Urology | Admitting: Urology

## 2020-06-27 DIAGNOSIS — S37012A Minor contusion of left kidney, initial encounter: Secondary | ICD-10-CM | POA: Diagnosis not present

## 2020-06-27 DIAGNOSIS — N289 Disorder of kidney and ureter, unspecified: Secondary | ICD-10-CM | POA: Diagnosis not present

## 2020-07-04 ENCOUNTER — Encounter: Payer: Self-pay | Admitting: Urology

## 2020-07-04 ENCOUNTER — Ambulatory Visit: Payer: BLUE CROSS/BLUE SHIELD | Admitting: Urology

## 2020-07-04 ENCOUNTER — Other Ambulatory Visit: Payer: Self-pay

## 2020-07-04 ENCOUNTER — Ambulatory Visit (INDEPENDENT_AMBULATORY_CARE_PROVIDER_SITE_OTHER): Payer: BLUE CROSS/BLUE SHIELD | Admitting: Urology

## 2020-07-04 VITALS — BP 119/73 | HR 90 | Temp 98.4°F | Ht 59.0 in | Wt 208.0 lb

## 2020-07-04 DIAGNOSIS — R3915 Urgency of urination: Secondary | ICD-10-CM | POA: Insufficient documentation

## 2020-07-04 DIAGNOSIS — S37012D Minor contusion of left kidney, subsequent encounter: Secondary | ICD-10-CM

## 2020-07-04 LAB — MICROSCOPIC EXAMINATION

## 2020-07-04 LAB — URINALYSIS, ROUTINE W REFLEX MICROSCOPIC
Bilirubin, UA: NEGATIVE
Glucose, UA: NEGATIVE
Ketones, UA: NEGATIVE
Nitrite, UA: NEGATIVE
Protein,UA: NEGATIVE
Specific Gravity, UA: >1.03 — ABNORMAL HIGH (ref 1.005–1.030)
Urobilinogen, Ur: 0.2 mg/dL (ref 0.2–1.0)
pH, UA: 5 (ref 5.0–7.5)

## 2020-07-04 MED ORDER — MIRABEGRON ER 50 MG PO TB24: 50.0000 mg | ORAL_TABLET | Freq: Every day | ORAL | 0 refills | Status: DC

## 2020-07-04 NOTE — Patient Instructions (Signed)
Hematoma A hematoma is a collection of blood. A hematoma can happen:  Under the skin.  In an organ.  In a body space.  In a joint space.  In other tissues. The blood can thicken (clot) to form a lump that you can see and feel. The lump is often hard and may become sore and tender. The lump can be very small or very big. Most hematomas get better in a few days to weeks. However, some hematomas may be serious and need medical care. What are the causes? This condition is caused by:  An injury.  Blood that leaks under the skin.  Problems from surgeries.  Medical conditions that cause bleeding or bruising. What increases the risk? You are more likely to develop this condition if:  You are an older adult.  You use medicines that thin your blood. What are the signs or symptoms? Symptoms depend on where the hematoma is in your body.  If the hematoma is under the skin, there is: ? A firm lump on the body. ? Pain and tenderness in the area. ? Bruising. The skin above the lump may be blue, dark blue, purple-red, or yellowish.  If the hematoma is deep in the tissues or body spaces, there may be: ? Blood in the stomach. This may cause pain in the belly (abdomen), weakness, passing out (fainting), and shortness of breath. ? Blood in the head. This may cause a headache, weakness, trouble speaking or understanding speech, or passing out. How is this diagnosed? This condition is diagnosed based on:  Your medical history.  A physical exam.  Imaging tests, such as ultrasound or CT scan.  Blood tests. How is this treated? Treatment depends on the cause, size, and location of the hematoma. Treatment may include:  Doing nothing. Many hematomas go away on their own without treatment.  Surgery or close monitoring. This may be needed for large hematomas or hematomas that affect the body's organs.  Medicines. These may be given if a medical condition caused the hematoma. Follow these  instructions at home: Managing pain, stiffness, and swelling   If told, put ice on the area. ? Put ice in a plastic bag. ? Place a towel between your skin and the bag. ? Leave the ice on for 20 minutes, 2-3 times a day for the first two days.  If told, put heat on the affected area after putting ice on the area for two days. Use the heat source that your doctor tells you to use. This could be a moist heat pack or a heating pad. To do this: ? Place a towel between your skin and the heat source. ? Leave the heat on for 20-30 minutes. ? Remove the heat if your skin turns bright red. This is very important if you are unable to feel pain, heat, or cold. You may have a greater risk of getting burned.  Raise (elevate) the affected area above the level of your heart while you are sitting or lying down.  Wrap the affected area with an elastic bandage, if told by your doctor. Do not wrap the bandage too tightly.  If your hematoma is on a leg or foot and is painful, your doctor may give you crutches. Use them as told by your doctor. General instructions  Take over-the-counter and prescription medicines only as told by your doctor.  Keep all follow-up visits as told by your doctor. This is important. Contact a doctor if:  You have a fever.    The swelling or bruising gets worse.  You start to get more hematomas. Get help right away if:  Your pain gets worse.  Your pain is not getting better with medicine.  Your skin over the hematoma breaks or starts to bleed.  Your hematoma is in your chest or belly and you: ? Pass out. ? Feel weak. ? Become short of breath.  You have a hematoma on your scalp that is caused by a fall or injury, and you: ? Have a headache that gets worse. ? Have trouble speaking or understanding speech. ? Become less alert or you pass out. Summary  A hematoma is a collection of blood in any part of your body.  Most hematomas get better on their own in a few days  to weeks. Some may need medical care.  Follow instructions from your doctor about how to care for your hematoma.  Contact a doctor if the swelling or bruising gets worse, or if you are short of breath. This information is not intended to replace advice given to you by your health care provider. Make sure you discuss any questions you have with your health care provider. Document Revised: 01/28/2018 Document Reviewed: 01/28/2018 Elsevier Patient Education  2020 Elsevier Inc.  

## 2020-07-04 NOTE — Progress Notes (Signed)
Urological Symptom Review  Patient is experiencing the following symptoms: Frequent urination Hard to postpone urination Get up at night to urinate Leakage of urine   Review of Systems  Gastrointestinal (upper)  : Negative for upper GI symptoms  Gastrointestinal (lower) : Negative for lower GI symptoms  Constitutional : Negative for symptoms  Skin: Itching  Eyes: Negative for eye symptoms  Ear/Nose/Throat : Negative for Ear/Nose/Throat symptoms  Hematologic/Lymphatic: Easy bruising  Cardiovascular : Negative for cardiovascular symptoms  Respiratory : Shortness of breath  Endocrine: Negative for endocrine symptoms  Musculoskeletal: Back pain Joint pain  Neurological: Negative for neurological symptoms  Psychologic: Anxiety

## 2020-07-04 NOTE — Progress Notes (Signed)
07/04/2020 2:00 PM   Laura Lane September 20, 1955 [id number]  Referring provider: Neale Burly, MD [address] [place],  [place] [address]  followup renal hematoma  HPI: [name] is a 64yo here for followup for a left renal hematoma. No left flank pain. She has chronic back pain. She does have urinary urgency with rare urge incontinence. Nocturia 1-3x. She has urinary frequency every 1-2 hours. Stream good   PMH: Past Medical History:  Diagnosis Date  . CHF (congestive heart failure) ([place])   . COPD (chronic obstructive pulmonary disease) ([place])   . Hx of CABG   . Hypothyroid   . MI (myocardial infarction) ([place])   . Renal disorder   . Thyroid disease     Surgical History: Past Surgical History:  Procedure Laterality Date  . CARDIAC SURGERY    . CORONARY ARTERY BYPASS GRAFT    . INSERTION OF DIALYSIS CATHETER    . removal of cyst on labia       . TRACHEOSTOMY      Home Medications:  Allergies as of 07/04/2020      Reactions   Prednisone Itching   Zofran [ondansetron] Nausea And Vomiting, Other (See Comments)      Medication List       Accurate as of July 04, 2020  2:00 PM. If you have any questions, ask your nurse or doctor.        STOP taking these medications   furosemide 40 MG tablet Commonly known as: LASIX Stopped by: Nicolette Bang, MD   lisinopril 2.5 MG tablet Commonly known as: ZESTRIL Stopped by: Nicolette Bang, MD   metoprolol tartrate 25 MG tablet Commonly known as: LOPRESSOR Stopped by: Nicolette Bang, MD   morphine 15 MG tablet Commonly known as: MSIR Stopped by: Nicolette Bang, MD   pantoprazole 40 MG tablet Commonly known as: PROTONIX Stopped by: Nicolette Bang, MD   polyethylene glycol 17 g packet Commonly known as: MIRALAX / GLYCOLAX Stopped by: Nicolette Bang, MD   warfarin 4 MG tablet Commonly known as: COUMADIN Stopped by: Nicolette Bang, MD     TAKE these medications   ALPRAZolam 1 MG  tablet Commonly known as: XANAX Take 1 mg by mouth 2 (two) times daily.   aspirin EC 81 MG tablet Take 81 mg by mouth daily.   atorvastatin 10 MG tablet Commonly known as: LIPITOR Take 10 mg by mouth daily.   calcium-vitamin D 250-100 MG-UNIT tablet Take 1 tablet by mouth 2 (two) times daily.   ibandronate 150 MG tablet Commonly known as: BONIVA Take 150 mg by mouth every 30 (thirty) days. Take in the morning with a full glass of water, on an empty stomach, and do not take anything else by mouth or lie down for the next 30 min.   levothyroxine 125 MCG tablet Commonly known as: SYNTHROID Take 100 mcg by mouth daily before breakfast.   Euthyrox 100 MCG tablet Generic drug: levothyroxine Take 100 mcg by mouth daily.   ProAir RespiClick 253 (90 Base) MCG/ACT Aepb Generic drug: Albuterol Sulfate Inhale into the lungs.   Tylenol 8 Hour 650 MG CR tablet Generic drug: acetaminophen Take 650 mg by mouth every 8 (eight) hours as needed for pain.       Allergies:  Allergies  Allergen Reactions  . Prednisone Itching  . Zofran [Ondansetron] Nausea And Vomiting and Other (See Comments)    Family History: Family History  Problem Relation Age of Onset  . Lung disease  Father   . Breast cancer Mother   . Kidney disease Mother   . CAD Neg Hx     Social History:  reports that she quit smoking about 6 years ago. She started smoking about 47 years ago. She has never used smokeless tobacco. She reports that she does not drink alcohol and does not use drugs.  ROS: All other review of systems were reviewed and are negative except what is noted above in HPI  Physical Exam: BP 119/73   Pulse 90   Temp 98.4 F (36.9 C)   Ht 4\' 11"  (1.499 m)   Wt 208 lb (94.3 kg)   BMI 42.01 kg/m   Constitutional:  Alert and oriented, No acute distress. HEENT: [place] AT, moist mucus membranes.  Trachea midline, no masses. Cardiovascular: No clubbing, cyanosis, or edema. Respiratory: Normal  respiratory effort, no increased work of breathing. GI: Abdomen is soft, nontender, nondistended, no abdominal masses GU: No CVA tenderness.  Lymph: No cervical or inguinal lymphadenopathy. Skin: No rashes, bruises or suspicious lesions. Neurologic: Grossly intact, no focal deficits, moving all 4 extremities. Psychiatric: Normal mood and affect.  Laboratory Data: Lab Results  Component Value Date   WBC 8.8 06/25/2014   HGB 8.4 (L) 06/25/2014   HCT 26.8 (L) 06/25/2014   MCV 97.5 06/25/2014   PLT 382 06/25/2014    Lab Results  Component Value Date   CREATININE 1.20 (H) 11/11/2019    No results found for: PSA  No results found for: TESTOSTERONE  Lab Results  Component Value Date   HGBA1C 6.0 (H) 06/23/2014    Urinalysis    Component Value Date/Time   COLORURINE YELLOW 06/23/2014 0145   APPEARANCEUR CLEAR 06/23/2014 0145   LABSPEC 1.009 06/23/2014 0145   PHURINE 5.0 06/23/2014 0145   GLUCOSEU NEGATIVE 06/23/2014 0145   HGBUR NEGATIVE 06/23/2014 0145   BILIRUBINUR neg 09/21/2019 1153   KETONESUR NEGATIVE 06/23/2014 0145   PROTEINUR Positive (A) 09/21/2019 1153   PROTEINUR NEGATIVE 06/23/2014 0145   UROBILINOGEN negative (A) 09/21/2019 1153   UROBILINOGEN 0.2 06/23/2014 0145   NITRITE neg 09/21/2019 1153   NITRITE NEGATIVE 06/23/2014 0145   LEUKOCYTESUR Trace (A) 09/21/2019 1153    No results found for: LABMICR, [place], RBCUA, LABEPIT, MUCUS, BACTERIA  Pertinent Imaging: Renal US 06/28/2020: Images reviewed and discussed with the patient  No results found for this or any previous visit.  No results found for this or any previous visit.  No results found for this or any previous visit.  No results found for this or any previous visit.  Results for orders placed during the hospital encounter of 06/27/20  Ultrasound renal complete  Narrative CLINICAL DATA:  64 year old female with history of renal mass, renal hematoma.  EXAM: RENAL / URINARY TRACT  ULTRASOUND COMPLETE  COMPARISON:  Abdomen MRI 11/11/2019. [facility] CT Abdomen and Pelvis 08/23/2019.  FINDINGS: Right Kidney:  Renal measurements: 9.0 x 4.2 x 3.9 cm = volume: 76 mL. Cortical echogenicity at the upper limits of normal. No right hydronephrosis. Several small benign appearing cortical cysts redemonstrated (up to 10 mm image 49). Fewer than 10 cysts overall.  Left Kidney:  Renal measurements: 8.6 x 4.4 x 3.9 cm = volume: 78 mL. Background cortical echogenicity within normal limits. No left hydronephrosis. Vague increased hypoechogenicity along the lateral midpole (image 85) likely corresponds to the residual subcapsular hematoma demonstrated by MRI in March (series 4, image 11 of that exam). No new left renal lesion.  Bladder:  Appears normal for degree of bladder distention.  Other:  None.  IMPRESSION: No new renal abnormality identified. Suspect stable residual subcapsular hematoma along the left kidney lateral midpole.   Electronically Signed By: Genevie Ann M.D. On: 06/28/2020 01:45  No results found for this or any previous visit.  No results found for this or any previous visit.  No results found for this or any previous visit.   Assessment & Plan:    1. Hematoma of left kidney, subsequent encounter -RTC 6 months with a renal US - Urinalysis, Routine w reflex microscopic  2. Urinary urgency -We will trial mirabegron 25mg     No follow-ups on file.  Nicolette Bang, MD  [facility] [place]

## 2020-09-27 DIAGNOSIS — E1121 Type 2 diabetes mellitus with diabetic nephropathy: Secondary | ICD-10-CM | POA: Diagnosis not present

## 2020-09-27 DIAGNOSIS — E7849 Other hyperlipidemia: Secondary | ICD-10-CM | POA: Diagnosis not present

## 2020-09-27 DIAGNOSIS — I10 Essential (primary) hypertension: Secondary | ICD-10-CM | POA: Diagnosis not present

## 2020-09-27 DIAGNOSIS — E038 Other specified hypothyroidism: Secondary | ICD-10-CM | POA: Diagnosis not present

## 2020-10-24 DIAGNOSIS — R600 Localized edema: Secondary | ICD-10-CM | POA: Diagnosis not present

## 2020-10-24 DIAGNOSIS — J90 Pleural effusion, not elsewhere classified: Secondary | ICD-10-CM | POA: Diagnosis not present

## 2020-10-24 DIAGNOSIS — E039 Hypothyroidism, unspecified: Secondary | ICD-10-CM | POA: Diagnosis not present

## 2020-10-24 DIAGNOSIS — R0602 Shortness of breath: Secondary | ICD-10-CM | POA: Diagnosis not present

## 2020-10-24 DIAGNOSIS — J159 Unspecified bacterial pneumonia: Secondary | ICD-10-CM | POA: Diagnosis not present

## 2020-10-24 DIAGNOSIS — J9600 Acute respiratory failure, unspecified whether with hypoxia or hypercapnia: Secondary | ICD-10-CM | POA: Diagnosis not present

## 2020-10-24 DIAGNOSIS — E1142 Type 2 diabetes mellitus with diabetic polyneuropathy: Secondary | ICD-10-CM | POA: Diagnosis not present

## 2020-10-24 DIAGNOSIS — I509 Heart failure, unspecified: Secondary | ICD-10-CM | POA: Diagnosis not present

## 2020-10-24 DIAGNOSIS — J168 Pneumonia due to other specified infectious organisms: Secondary | ICD-10-CM | POA: Diagnosis not present

## 2020-10-24 DIAGNOSIS — R069 Unspecified abnormalities of breathing: Secondary | ICD-10-CM | POA: Diagnosis not present

## 2020-10-24 DIAGNOSIS — J9601 Acute respiratory failure with hypoxia: Secondary | ICD-10-CM | POA: Diagnosis not present

## 2020-10-24 DIAGNOSIS — R2232 Localized swelling, mass and lump, left upper limb: Secondary | ICD-10-CM | POA: Diagnosis not present

## 2020-10-24 DIAGNOSIS — J189 Pneumonia, unspecified organism: Secondary | ICD-10-CM | POA: Diagnosis not present

## 2020-10-24 DIAGNOSIS — R918 Other nonspecific abnormal finding of lung field: Secondary | ICD-10-CM | POA: Diagnosis not present

## 2020-10-24 DIAGNOSIS — J441 Chronic obstructive pulmonary disease with (acute) exacerbation: Secondary | ICD-10-CM | POA: Diagnosis not present

## 2020-10-24 DIAGNOSIS — E875 Hyperkalemia: Secondary | ICD-10-CM | POA: Diagnosis not present

## 2020-10-24 DIAGNOSIS — K529 Noninfective gastroenteritis and colitis, unspecified: Secondary | ICD-10-CM | POA: Diagnosis not present

## 2020-10-24 DIAGNOSIS — G4733 Obstructive sleep apnea (adult) (pediatric): Secondary | ICD-10-CM | POA: Diagnosis not present

## 2020-10-24 DIAGNOSIS — I129 Hypertensive chronic kidney disease with stage 1 through stage 4 chronic kidney disease, or unspecified chronic kidney disease: Secondary | ICD-10-CM | POA: Diagnosis not present

## 2020-10-24 DIAGNOSIS — Z6841 Body Mass Index (BMI) 40.0 and over, adult: Secondary | ICD-10-CM | POA: Diagnosis not present

## 2020-10-24 DIAGNOSIS — I2510 Atherosclerotic heart disease of native coronary artery without angina pectoris: Secondary | ICD-10-CM | POA: Diagnosis not present

## 2020-10-24 DIAGNOSIS — N182 Chronic kidney disease, stage 2 (mild): Secondary | ICD-10-CM | POA: Diagnosis not present

## 2020-10-24 DIAGNOSIS — G459 Transient cerebral ischemic attack, unspecified: Secondary | ICD-10-CM | POA: Diagnosis not present

## 2020-10-24 DIAGNOSIS — E1122 Type 2 diabetes mellitus with diabetic chronic kidney disease: Secondary | ICD-10-CM | POA: Diagnosis not present

## 2020-10-24 DIAGNOSIS — E785 Hyperlipidemia, unspecified: Secondary | ICD-10-CM | POA: Diagnosis not present

## 2020-10-24 DIAGNOSIS — I517 Cardiomegaly: Secondary | ICD-10-CM | POA: Diagnosis not present

## 2020-10-24 DIAGNOSIS — Z20822 Contact with and (suspected) exposure to covid-19: Secondary | ICD-10-CM | POA: Diagnosis not present

## 2020-10-25 DIAGNOSIS — J189 Pneumonia, unspecified organism: Secondary | ICD-10-CM | POA: Diagnosis not present

## 2020-10-25 DIAGNOSIS — J159 Unspecified bacterial pneumonia: Secondary | ICD-10-CM | POA: Diagnosis not present

## 2020-10-25 DIAGNOSIS — E785 Hyperlipidemia, unspecified: Secondary | ICD-10-CM | POA: Diagnosis not present

## 2020-10-25 DIAGNOSIS — I2510 Atherosclerotic heart disease of native coronary artery without angina pectoris: Secondary | ICD-10-CM | POA: Diagnosis not present

## 2020-10-26 DIAGNOSIS — E785 Hyperlipidemia, unspecified: Secondary | ICD-10-CM | POA: Diagnosis not present

## 2020-10-26 DIAGNOSIS — I2510 Atherosclerotic heart disease of native coronary artery without angina pectoris: Secondary | ICD-10-CM | POA: Diagnosis not present

## 2020-10-26 DIAGNOSIS — J159 Unspecified bacterial pneumonia: Secondary | ICD-10-CM | POA: Diagnosis not present

## 2020-10-26 DIAGNOSIS — R2232 Localized swelling, mass and lump, left upper limb: Secondary | ICD-10-CM | POA: Diagnosis not present

## 2020-10-26 DIAGNOSIS — J189 Pneumonia, unspecified organism: Secondary | ICD-10-CM | POA: Diagnosis not present

## 2020-10-27 DIAGNOSIS — J449 Chronic obstructive pulmonary disease, unspecified: Secondary | ICD-10-CM | POA: Diagnosis not present

## 2020-10-27 DIAGNOSIS — E785 Hyperlipidemia, unspecified: Secondary | ICD-10-CM | POA: Diagnosis not present

## 2020-10-27 DIAGNOSIS — J189 Pneumonia, unspecified organism: Secondary | ICD-10-CM | POA: Diagnosis not present

## 2020-10-27 DIAGNOSIS — J159 Unspecified bacterial pneumonia: Secondary | ICD-10-CM | POA: Diagnosis not present

## 2020-10-27 DIAGNOSIS — I2510 Atherosclerotic heart disease of native coronary artery without angina pectoris: Secondary | ICD-10-CM | POA: Diagnosis not present

## 2020-11-05 DIAGNOSIS — Z6841 Body Mass Index (BMI) 40.0 and over, adult: Secondary | ICD-10-CM | POA: Diagnosis not present

## 2020-11-05 DIAGNOSIS — J13 Pneumonia due to Streptococcus pneumoniae: Secondary | ICD-10-CM | POA: Diagnosis not present

## 2020-11-24 DIAGNOSIS — J449 Chronic obstructive pulmonary disease, unspecified: Secondary | ICD-10-CM | POA: Diagnosis not present

## 2020-11-27 DIAGNOSIS — Z6841 Body Mass Index (BMI) 40.0 and over, adult: Secondary | ICD-10-CM | POA: Diagnosis not present

## 2020-11-27 DIAGNOSIS — I872 Venous insufficiency (chronic) (peripheral): Secondary | ICD-10-CM | POA: Diagnosis not present

## 2020-12-25 ENCOUNTER — Ambulatory Visit (HOSPITAL_COMMUNITY): Payer: BLUE CROSS/BLUE SHIELD

## 2020-12-25 DIAGNOSIS — J449 Chronic obstructive pulmonary disease, unspecified: Secondary | ICD-10-CM | POA: Diagnosis not present

## 2020-12-31 ENCOUNTER — Ambulatory Visit (HOSPITAL_COMMUNITY)
Admission: RE | Admit: 2020-12-31 | Discharge: 2020-12-31 | Disposition: A | Discharge status: Home / Self Care | Payer: BLUE CROSS/BLUE SHIELD | Source: Ambulatory Visit | Attending: Urology | Admitting: Urology

## 2020-12-31 ENCOUNTER — Other Ambulatory Visit: Payer: Self-pay

## 2020-12-31 DIAGNOSIS — S37012A Minor contusion of left kidney, initial encounter: Secondary | ICD-10-CM | POA: Diagnosis not present

## 2020-12-31 DIAGNOSIS — N281 Cyst of kidney, acquired: Secondary | ICD-10-CM | POA: Diagnosis not present

## 2020-12-31 DIAGNOSIS — S37012D Minor contusion of left kidney, subsequent encounter: Secondary | ICD-10-CM | POA: Insufficient documentation

## 2021-01-02 ENCOUNTER — Encounter: Payer: Self-pay | Admitting: Urology

## 2021-01-02 ENCOUNTER — Telehealth (INDEPENDENT_AMBULATORY_CARE_PROVIDER_SITE_OTHER): Payer: BLUE CROSS/BLUE SHIELD | Admitting: Urology

## 2021-01-02 ENCOUNTER — Other Ambulatory Visit: Payer: Self-pay

## 2021-01-02 DIAGNOSIS — N2889 Other specified disorders of kidney and ureter: Secondary | ICD-10-CM | POA: Diagnosis not present

## 2021-01-02 DIAGNOSIS — R3915 Urgency of urination: Secondary | ICD-10-CM

## 2021-01-02 NOTE — Patient Instructions (Signed)

## 2021-01-02 NOTE — Progress Notes (Signed)
01/02/2021 1:24 PM   [name] [birth year] [id number]  Referring provider: Neale Burly, MD [address] [place],  [place] [address]   Patient location: home Physician location: office I connected with  Laura Lane Lane on 01/15/21 by a video enabled telemedicine application and verified that I am speaking with the correct person using two identifiers.   I discussed the limitations of evaluation and management by telemedicine. The patient expressed understanding and agreed to proceed.    followup renal mass and urinary urgency  HPI: Laura Lane Lane is a 65yo here for followup for a left renal mass and urinary urgency. Since starting mirabegron she noted nocturia resolved and has not recurred since stopping the medication. She denies any other LUTS. Renal US shows a 3cm left renal mass that appears to be resolving hematoma.    PMH: Past Medical History:  Diagnosis Date  . CHF (congestive heart failure) ([place])   . COPD (chronic obstructive pulmonary disease) ([place])   . Hx of CABG   . Hypothyroid   . MI (myocardial infarction) ([place])   . Renal disorder   . Thyroid disease     Surgical History: Past Surgical History:  Procedure Laterality Date  . CARDIAC SURGERY    . CORONARY ARTERY BYPASS GRAFT    . INSERTION OF DIALYSIS CATHETER    . removal of cyst on labia       . TRACHEOSTOMY      Home Medications:  Allergies as of 01/02/2021      Reactions   Prednisone Itching   Zofran [ondansetron] Nausea And Vomiting, Other (See Comments)      Medication List       Accurate as of January 02, 2021  1:24 PM. If you have any questions, ask your nurse or doctor.        ALPRAZolam 1 MG tablet Commonly known as: XANAX Take 1 mg by mouth 2 (two) times daily.   aspirin EC 81 MG tablet Take 81 mg by mouth daily.   atorvastatin 10 MG tablet Commonly known as: LIPITOR Take 10 mg by mouth daily.   calcium-vitamin D 250-100 MG-UNIT tablet Take 1 tablet by mouth 2 (two)  times daily.   ibandronate 150 MG tablet Commonly known as: BONIVA Take 150 mg by mouth every 30 (thirty) days. Take in the morning with a full glass of water, on an empty stomach, and do not take anything else by mouth or lie down for the next 30 min.   levothyroxine 125 MCG tablet Commonly known as: SYNTHROID Take 100 mcg by mouth daily before breakfast.   Euthyrox 100 MCG tablet Generic drug: levothyroxine Take 100 mcg by mouth daily.   mirabegron ER 50 MG Tb24 tablet Commonly known as: MYRBETRIQ Take 1 tablet (50 mg total) by mouth daily.   ProAir RespiClick 825 (90 Base) MCG/ACT Aepb Generic drug: Albuterol Sulfate Inhale into the lungs.   Tylenol 8 Hour 650 MG CR tablet Generic drug: acetaminophen Take 650 mg by mouth every 8 (eight) hours as needed for pain.       Allergies:  Allergies  Allergen Reactions  . Prednisone Itching  . Zofran [Ondansetron] Nausea And Vomiting and Other (See Comments)    Family History: Family History  Problem Relation Age of Onset  . Lung disease Father   . Breast cancer Mother   . Kidney disease Mother   . CAD Neg Hx     Social History:  reports that she quit smoking  about 6 years ago. She started smoking about 48 years ago. She has never used smokeless tobacco. She reports that she does not drink alcohol and does not use drugs.  ROS: All other review of systems were reviewed and are negative except what is noted above in HPI  Physical Exam: There were no vitals taken for this visit.  Constitutional:  Alert and oriented, No acute distress. HEENT: [place] AT, moist mucus membranes.  Trachea midline, no masses. Cardiovascular: No clubbing, cyanosis, or edema. Respiratory: Normal respiratory effort, no increased work of breathing. GI: Abdomen is soft, nontender, nondistended, no abdominal masses GU: No CVA tenderness.  Lymph: No cervical or inguinal lymphadenopathy. Skin: No rashes, bruises or suspicious lesions. Neurologic:  Grossly intact, no focal deficits, moving all 4 extremities. Psychiatric: Normal mood and affect.  Laboratory Data: Lab Results  Component Value Date   WBC 8.8 06/25/2014   HGB 8.4 (L) 06/25/2014   HCT 26.8 (L) 06/25/2014   MCV 97.5 06/25/2014   PLT 382 06/25/2014    Lab Results  Component Value Date   CREATININE 1.20 (H) 11/11/2019    No results found for: PSA  No results found for: TESTOSTERONE  Lab Results  Component Value Date   HGBA1C 6.0 (H) 06/23/2014    Urinalysis    Component Value Date/Time   COLORURINE YELLOW 06/23/2014 0145   APPEARANCEUR Clear 07/04/2020 1332   LABSPEC 1.009 06/23/2014 0145   PHURINE 5.0 06/23/2014 0145   GLUCOSEU Negative 07/04/2020 1332   HGBUR NEGATIVE 06/23/2014 0145   BILIRUBINUR Negative 07/04/2020 1332   KETONESUR NEGATIVE 06/23/2014 0145   PROTEINUR Negative 07/04/2020 1332   PROTEINUR NEGATIVE 06/23/2014 0145   UROBILINOGEN negative (A) 09/21/2019 1153   UROBILINOGEN 0.2 06/23/2014 0145   NITRITE Negative 07/04/2020 1332   NITRITE NEGATIVE 06/23/2014 0145   LEUKOCYTESUR Trace (A) 07/04/2020 1332    Lab Results  Component Value Date   LABMICR See below: 07/04/2020   WBCUA 0-5 07/04/2020   LABEPIT 0-10 07/04/2020   BACTERIA Few (A) 07/04/2020    Pertinent Imaging: Renal US: Images reviewed and discussed with the patient No results found for this or any previous visit.  No results found for this or any previous visit.  No results found for this or any previous visit.  No results found for this or any previous visit.  Results for orders placed during the hospital encounter of 12/31/20  US RENAL  Narrative CLINICAL DATA:  Left renal hematoma.  EXAM: RENAL / URINARY TRACT ULTRASOUND COMPLETE  COMPARISON:  Ultrasound 06/27/2020 MRI 11/11/2019.  FINDINGS: Right Kidney:  Renal measurements: 9.0 x 4.6 x 5.5 cm = volume: 115.1 mL. Echogenicity within normal limits. Cortical thinning. No hydronephrosis. Tiny  simple cysts best demonstrated on prior study. Largest cyst measures 1.5 cm.  Left Kidney:  Renal measurements: 10.0 x 5.0 x 3.9 cm = volume: 17.9 mL. Echogenicity within normal limits. No hydronephrosis. 3.3 cm hypoechoic focus again noted along the lateral margin of the left kidney, stable in appearance. Again this may represent residual left perinephric hematoma. Continued follow-up exams suggested to demonstrate resolution.  Bladder:  Appears normal for degree of bladder distention. Ureteral jets not visualized.  Other:  None.  IMPRESSION: 1. 3.3 cm hypoechoic focus again noted along the lateral margin left kidney, stable in appearance. Again this may represent residual left perinephric hematoma. Continued follow-up exam suggested to demonstrate resolution.  2. Right renal cortical thinning. Simple cysts right kidney again noted.   Electronically Signed By:  [place] On: 01/01/2021 10:30  No results found for this or any previous visit.  No results found for this or any previous visit.  No results found for this or any previous visit.   Assessment & Plan:    1. Renal mass Continue surveillance. RTC 6 month with MRI  2. Urinary urgency -Continue observation   Return in about 6 months (around 07/04/2021) for MRI abd.  Nicolette Bang, MD  [facility] [place]

## 2021-01-24 DIAGNOSIS — E038 Other specified hypothyroidism: Secondary | ICD-10-CM | POA: Diagnosis not present

## 2021-01-24 DIAGNOSIS — E1121 Type 2 diabetes mellitus with diabetic nephropathy: Secondary | ICD-10-CM | POA: Diagnosis not present

## 2021-01-24 DIAGNOSIS — J449 Chronic obstructive pulmonary disease, unspecified: Secondary | ICD-10-CM | POA: Diagnosis not present

## 2021-01-24 DIAGNOSIS — I10 Essential (primary) hypertension: Secondary | ICD-10-CM | POA: Diagnosis not present

## 2021-01-24 DIAGNOSIS — R197 Diarrhea, unspecified: Secondary | ICD-10-CM | POA: Diagnosis not present

## 2021-01-24 DIAGNOSIS — I872 Venous insufficiency (chronic) (peripheral): Secondary | ICD-10-CM | POA: Diagnosis not present

## 2021-01-24 DIAGNOSIS — E7849 Other hyperlipidemia: Secondary | ICD-10-CM | POA: Diagnosis not present

## 2021-02-24 DIAGNOSIS — J449 Chronic obstructive pulmonary disease, unspecified: Secondary | ICD-10-CM | POA: Diagnosis not present

## 2021-03-26 DIAGNOSIS — J449 Chronic obstructive pulmonary disease, unspecified: Secondary | ICD-10-CM | POA: Diagnosis not present

## 2021-04-30 DIAGNOSIS — Z6841 Body Mass Index (BMI) 40.0 and over, adult: Secondary | ICD-10-CM | POA: Diagnosis not present

## 2021-04-30 DIAGNOSIS — Z0000 Encounter for general adult medical examination without abnormal findings: Secondary | ICD-10-CM | POA: Diagnosis not present

## 2021-05-02 ENCOUNTER — Encounter (INDEPENDENT_AMBULATORY_CARE_PROVIDER_SITE_OTHER): Payer: Self-pay | Admitting: *Deleted

## 2021-06-26 ENCOUNTER — Ambulatory Visit (HOSPITAL_COMMUNITY)
Admission: RE | Admit: 2021-06-26 | Discharge: 2021-06-26 | Disposition: A | Discharge status: Home / Self Care | Payer: BLUE CROSS/BLUE SHIELD | Source: Ambulatory Visit | Attending: Urology | Admitting: Urology

## 2021-06-26 ENCOUNTER — Other Ambulatory Visit: Payer: Self-pay

## 2021-06-26 DIAGNOSIS — K8020 Calculus of gallbladder without cholecystitis without obstruction: Secondary | ICD-10-CM | POA: Diagnosis not present

## 2021-06-26 DIAGNOSIS — K439 Ventral hernia without obstruction or gangrene: Secondary | ICD-10-CM | POA: Diagnosis not present

## 2021-06-26 DIAGNOSIS — N281 Cyst of kidney, acquired: Secondary | ICD-10-CM | POA: Diagnosis not present

## 2021-06-26 DIAGNOSIS — N2889 Other specified disorders of kidney and ureter: Secondary | ICD-10-CM | POA: Diagnosis not present

## 2021-06-26 DIAGNOSIS — K449 Diaphragmatic hernia without obstruction or gangrene: Secondary | ICD-10-CM | POA: Diagnosis not present

## 2021-06-26 MED ORDER — GADOBUTROL 1 MMOL/ML IV SOLN
10.0000 mL | Freq: Once | INTRAVENOUS | Status: AC | PRN
  Administered 2021-06-26: 10 mL via INTRAVENOUS

## 2021-07-02 DIAGNOSIS — H26493 Other secondary cataract, bilateral: Secondary | ICD-10-CM | POA: Diagnosis not present

## 2021-07-03 ENCOUNTER — Ambulatory Visit (INDEPENDENT_AMBULATORY_CARE_PROVIDER_SITE_OTHER): Payer: BLUE CROSS/BLUE SHIELD | Admitting: Urology

## 2021-07-03 ENCOUNTER — Encounter: Payer: Self-pay | Admitting: Urology

## 2021-07-03 ENCOUNTER — Other Ambulatory Visit: Payer: Self-pay

## 2021-07-03 VITALS — BP 128/76 | HR 72 | Temp 98.0°F

## 2021-07-03 DIAGNOSIS — N2889 Other specified disorders of kidney and ureter: Secondary | ICD-10-CM

## 2021-07-03 DIAGNOSIS — R3915 Urgency of urination: Secondary | ICD-10-CM

## 2021-07-03 LAB — MICROSCOPIC EXAMINATION: Renal Epithel, UA: NONE SEEN /hpf

## 2021-07-03 LAB — URINALYSIS, ROUTINE W REFLEX MICROSCOPIC
Bilirubin, UA: NEGATIVE
Glucose, UA: NEGATIVE
Ketones, UA: NEGATIVE
Leukocytes,UA: NEGATIVE
Nitrite, UA: NEGATIVE
Protein,UA: NEGATIVE
Specific Gravity, UA: 1.015 (ref 1.005–1.030)
Urobilinogen, Ur: 0.2 mg/dL (ref 0.2–1.0)
pH, UA: 7 (ref 5.0–7.5)

## 2021-07-03 NOTE — Progress Notes (Signed)
07/03/2021 2:44 PM   Laura Lane Laura Lane 25-Aug-1956 [id number]  Referring provider: Neale Burly, MD [address] [place],  [place] [address]  Followup left renal mass/hematoma   HPI: [name] is [phone] here for followup for left renal mas/hematoma. MRI 10/19 shows resolution of the left renal hematoma and no underlying renal mass.  She denies any flank pain. She has stable urinary urgency and frequency. She stopped the mirabegron and her LUTS have significantly improved. No nocturia. No other complaints today.    PMH: Past Medical History:  Diagnosis Date   CHF (congestive heart failure) (HCC)    COPD (chronic obstructive pulmonary disease) (HCC)    Hx of CABG    Hypothyroid    MI (myocardial infarction) ([facility])    Renal disorder    Thyroid disease     Surgical History: Past Surgical History:  Procedure Laterality Date   CARDIAC SURGERY     CORONARY ARTERY BYPASS GRAFT     INSERTION OF DIALYSIS CATHETER     removal of cyst on labia        TRACHEOSTOMY      Home Medications:  Allergies as of 07/03/2021       Reactions   Prednisone Itching   Zofran [ondansetron] Nausea And Vomiting, Other (See Comments)        Medication List        Accurate as of July 03, 2021  2:44 PM. If you have any questions, ask your nurse or doctor.          STOP taking these medications    aspirin EC 81 MG tablet Stopped by: Nicolette Bang, MD   atorvastatin 10 MG tablet Commonly known as: LIPITOR Stopped by: Nicolette Bang, MD   calcium-vitamin D 250-100 MG-UNIT tablet Stopped by: Nicolette Bang, MD   ibandronate 150 MG tablet Commonly known as: BONIVA Stopped by: Nicolette Bang, MD   mirabegron ER 50 MG Tb24 tablet Commonly known as: MYRBETRIQ Stopped by: Nicolette Bang, MD       TAKE these medications    acetaminophen 650 MG CR tablet Commonly known as: TYLENOL Take 650 mg by mouth every 8 (eight) hours as needed for pain.   ALPRAZolam 1  MG tablet Commonly known as: XANAX Take 1 mg by mouth 2 (two) times daily.   Euthyrox 100 MCG tablet Generic drug: levothyroxine Take 100 mcg by mouth daily. What changed: Another medication with the same name was removed. Continue taking this medication, and follow the directions you see here. Changed by: Nicolette Bang, MD   Lagevrio 200 MG Caps capsule Generic drug: molnupiravir EUA Take 4 capsules by mouth 2 (two) times daily.   ProAir RespiClick 540 (90 Base) MCG/ACT Aepb Generic drug: Albuterol Sulfate Inhale into the lungs.        Allergies:  Allergies  Allergen Reactions   Prednisone Itching   Zofran [Ondansetron] Nausea And Vomiting and Other (See Comments)    Family History: Family History  Problem Relation Age of Onset   Lung disease Father    Breast cancer Mother    Kidney disease Mother    CAD Neg Hx     Social History:  reports that she quit smoking about 7 years ago. Her smoking use included cigarettes. She started smoking about 48 years ago. She has never used smokeless tobacco. She reports that she does not drink alcohol and does not use drugs.  ROS: All other review of systems were reviewed and are negative except  what is noted above in HPI  Physical Exam: BP 128/76   Pulse 72   Temp 98 F (36.7 C)   Constitutional:  Alert and oriented, No acute distress. HEENT: [place] AT, moist mucus membranes.  Trachea midline, no masses. Cardiovascular: No clubbing, cyanosis, or edema. Respiratory: Normal respiratory effort, no increased work of breathing. GI: Abdomen is soft, nontender, nondistended, no abdominal masses GU: No CVA tenderness.  Lymph: No cervical or inguinal lymphadenopathy. Skin: No rashes, bruises or suspicious lesions. Neurologic: Grossly intact, no focal deficits, moving all 4 extremities. Psychiatric: Normal mood and affect.  Laboratory Data: Lab Results  Component Value Date   WBC 8.8 06/25/2014   HGB 8.4 (L) 06/25/2014   HCT  26.8 (L) 06/25/2014   MCV 97.5 06/25/2014   PLT 382 06/25/2014    Lab Results  Component Value Date   CREATININE 1.20 (H) 11/11/2019    No results found for: PSA  No results found for: TESTOSTERONE  Lab Results  Component Value Date   HGBA1C 6.0 (H) 06/23/2014    Urinalysis    Component Value Date/Time   COLORURINE YELLOW 06/23/2014 0145   APPEARANCEUR Clear 07/04/2020 1332   LABSPEC 1.009 06/23/2014 0145   PHURINE 5.0 06/23/2014 0145   GLUCOSEU Negative 07/04/2020 1332   HGBUR NEGATIVE 06/23/2014 0145   BILIRUBINUR Negative 07/04/2020 1332   KETONESUR NEGATIVE 06/23/2014 0145   PROTEINUR Negative 07/04/2020 1332   PROTEINUR NEGATIVE 06/23/2014 0145   UROBILINOGEN negative (A) 09/21/2019 1153   UROBILINOGEN 0.2 06/23/2014 0145   NITRITE Negative 07/04/2020 1332   NITRITE NEGATIVE 06/23/2014 0145   LEUKOCYTESUR Trace (A) 07/04/2020 1332    Lab Results  Component Value Date   LABMICR See below: 07/04/2020   WBCUA 0-5 07/04/2020   LABEPIT 0-10 07/04/2020   BACTERIA Few (A) 07/04/2020    Pertinent Imaging: MRI 10/19: Results reviewed and discussed with the patient No results found for this or any previous visit.  No results found for this or any previous visit.  No results found for this or any previous visit.  No results found for this or any previous visit.  Results for orders placed during the hospital encounter of 12/31/20  US RENAL  Narrative CLINICAL DATA:  Left renal hematoma.  EXAM: RENAL / URINARY TRACT ULTRASOUND COMPLETE  COMPARISON:  Ultrasound 06/27/2020 MRI 11/11/2019.  FINDINGS: Right Kidney:  Renal measurements: 9.0 x 4.6 x 5.5 cm = volume: 115.1 mL. Echogenicity within normal limits. Cortical thinning. No hydronephrosis. Tiny simple cysts best demonstrated on prior study. Largest cyst measures 1.5 cm.  Left Kidney:  Renal measurements: 10.0 x 5.0 x 3.9 cm = volume: 17.9 mL. Echogenicity within normal limits. No  hydronephrosis. 3.3 cm hypoechoic focus again noted along the lateral margin of the left kidney, stable in appearance. Again this may represent residual left perinephric hematoma. Continued follow-up exams suggested to demonstrate resolution.  Bladder:  Appears normal for degree of bladder distention. Ureteral jets not visualized.  Other:  None.  IMPRESSION: 1. 3.3 cm hypoechoic focus again noted along the lateral margin left kidney, stable in appearance. Again this may represent residual left perinephric hematoma. Continued follow-up exam suggested to demonstrate resolution.  2. Right renal cortical thinning. Simple cysts right kidney again noted.   Electronically Signed By: Marcello Moores  Register On: 01/01/2021 10:30  No results found for this or any previous visit.  No results found for this or any previous visit.  No results found for this or any previous visit.  Assessment & Plan:    1. Renal mass/left renal hematoma.  -resolved on MRI  2. Urinary urgency -patient defers therapy at this time. She will continue timed voiding to prevent urinary urgency episodes  No follow-ups on file.  Nicolette Bang, MD  [facility] [place]

## 2021-07-03 NOTE — Addendum Note (Signed)
Addended by: Dorisann Frames on: 07/03/2021 03:12 PM   Modules accepted: Orders

## 2021-07-03 NOTE — Patient Instructions (Signed)

## 2021-07-03 NOTE — Progress Notes (Signed)
Urological Symptom Review  Patient is experiencing the following symptoms: Injury to kidneys/bladder   Review of Systems  Gastrointestinal (upper)  : Negative for upper GI symptoms  Gastrointestinal (lower) : Diarrhea Constipation  Constitutional : Fatigue  Skin: Itching  Eyes: Negative for eye symptoms  Ear/Nose/Throat : Negative for Ear/Nose/Throat symptoms  Hematologic/Lymphatic: Negative for Hematologic/Lymphatic symptoms  Cardiovascular : Negative for cardiovascular symptoms  Respiratory : Shortness of breath  Endocrine: Negative for endocrine symptoms  Musculoskeletal: Back pain Joint pain  Neurological: Dizziness  Psychologic: Anxiety

## 2021-08-06 DIAGNOSIS — E038 Other specified hypothyroidism: Secondary | ICD-10-CM | POA: Diagnosis not present

## 2021-08-06 DIAGNOSIS — I10 Essential (primary) hypertension: Secondary | ICD-10-CM | POA: Diagnosis not present

## 2021-08-06 DIAGNOSIS — J4540 Moderate persistent asthma, uncomplicated: Secondary | ICD-10-CM | POA: Diagnosis not present

## 2021-08-06 DIAGNOSIS — E7849 Other hyperlipidemia: Secondary | ICD-10-CM | POA: Diagnosis not present

## 2021-08-06 DIAGNOSIS — I872 Venous insufficiency (chronic) (peripheral): Secondary | ICD-10-CM | POA: Diagnosis not present

## 2021-08-13 DIAGNOSIS — Z1231 Encounter for screening mammogram for malignant neoplasm of breast: Secondary | ICD-10-CM | POA: Diagnosis not present

## 2021-09-16 ENCOUNTER — Ambulatory Visit (INDEPENDENT_AMBULATORY_CARE_PROVIDER_SITE_OTHER): Payer: 59 | Admitting: Gastroenterology

## 2021-09-16 ENCOUNTER — Telehealth (INDEPENDENT_AMBULATORY_CARE_PROVIDER_SITE_OTHER): Payer: Self-pay

## 2021-09-16 ENCOUNTER — Other Ambulatory Visit: Payer: Self-pay

## 2021-09-16 ENCOUNTER — Encounter (INDEPENDENT_AMBULATORY_CARE_PROVIDER_SITE_OTHER): Payer: Self-pay

## 2021-09-16 ENCOUNTER — Encounter (INDEPENDENT_AMBULATORY_CARE_PROVIDER_SITE_OTHER): Payer: Self-pay | Admitting: Gastroenterology

## 2021-09-16 VITALS — BP 102/62 | HR 66 | Temp 98.1°F | Ht 59.0 in | Wt 221.9 lb

## 2021-09-16 DIAGNOSIS — D649 Anemia, unspecified: Secondary | ICD-10-CM

## 2021-09-16 DIAGNOSIS — R194 Change in bowel habit: Secondary | ICD-10-CM

## 2021-09-16 MED ORDER — PEG 3350-KCL-NA BICARB-NACL 420 G PO SOLR: 4000.0000 mL | ORAL | 0 refills | Status: DC

## 2021-09-16 NOTE — Patient Instructions (Addendum)
Schedule colonoscopy Perform blood workup Continue probiotics daily Can take Imodium as needed if recurrent diarrhea

## 2021-09-16 NOTE — Telephone Encounter (Signed)
Patient states she has has already had most of the labs ordered today done at Dr. Peggyann Juba office. I advised that she please have his office fax the labs they did on her in November 5198 as to not duplicate any. I gave her the fax number to our office so they can do so. I asked the patient to follow up with me to let me know she has reached out to her contact Amy there.

## 2021-09-16 NOTE — Progress Notes (Addendum)
[name], M.D. Gastroenterology & Hepatology [facility] [address] [place], [place] [address] Primary Care Physician: Neale Burly, MD [place] [address]  Referring MD: PCP  Chief Complaint: Change in bowel movement frequency  History of Present Illness: Laura Lane is a 66 y.o. female with past medical history of congestive heart failure, COPD, hypothyroidism, myocardial infarction status post CABG, who presents for evaluation of change in bowel movement frequency.  Patient reports that she has presented a chronic history of fluctuating change in her bowel movements, for at least 20 years. She states she was mostly constipated in the past with occasional episodes of diarrhea intermittently.  She does not elaborate too much regarding the details of her previous symptoms even though I asked her multiple times what kind of symptoms she had in the past.  However, she reports that in February 2022 she had pneumonia and required to have antibiotics. This was followed by more frequent episodes of diarrhea up to 10 bowel movements per day which she describes as frequent little pieces of stool. She also had episodes of fecal urgency previously but no fecal incontinence. Denies any melena or hematochezia. She took Peptobismol but she got scared as her stool turned black.  Has not been taking any other antidiarrheals on a frequent basis although she tried taking Imodium previously.  No nighttime symptoms.  She states that if she eats ice cream she has noticed mucus in her stools with diarrhea. She also has noticed that "sugary meals" lead to these symptoms.  The patient states that she started a probiotic (Culturelle) recently, which she feels has helped in the sense she does not have more tenesmus and frequency of her bowel movements.   The patient denies having any nausea, vomiting, fever, chills, hematochezia,  melena, hematemesis, abdominal distention, abdominal pain, jaundice, pruritus or weight loss.  Upon review of her previous labs from 10/27/2020, she was found to have CBC with hemoglobin of 10.8, MCV of 95, although cell count of 12.2 and platelets 313, BMP showed mild elevation of the creatinine up to 1.27.  No other labs are available.  Last SJG:GEZMO Last Colonoscopy:possibly >10 years ago, no reports are available.  FHx: neg for any gastrointestinal/liver disease, mother breast cancer Social: neg smoking, alcohol or illicit drug use Surgical: no abdominal surgeries  Past Medical History: Past Medical History:  Diagnosis Date   CHF (congestive heart failure) (HCC)    COPD (chronic obstructive pulmonary disease) (HCC)    Hx of CABG    Hypothyroid    MI (myocardial infarction) ([place])    Renal disorder    Thyroid disease     Past Surgical History: Past Surgical History:  Procedure Laterality Date   CARDIAC SURGERY     CORONARY ARTERY BYPASS GRAFT     INSERTION OF DIALYSIS CATHETER     removal of cyst on labia        TRACHEOSTOMY      Family History: Family History  Problem Relation Age of Onset   Lung disease Father    Breast cancer Mother    Kidney disease Mother    CAD Neg Hx     Social History: Social History   Tobacco Use  Smoking Status Former   Types: Cigarettes   Start date: 08/17/1972   Quit date: 02/06/2014   Years since quitting: 7.6  Smokeless Tobacco Never   Social History   Substance and Sexual Activity  Alcohol Use No   Alcohol/week: 0.0 standard drinks   Social History   Substance and Sexual Activity  Drug Use No    Allergies: Allergies  Allergen Reactions   Prednisone Itching   Zofran [Ondansetron] Nausea And Vomiting and Other (See Comments)    Medications: Current Outpatient Medications  Medication Sig Dispense Refill   acetaminophen (TYLENOL) 650 MG CR tablet Take 650 mg by mouth every 8 (eight) hours as needed for pain.      Albuterol Sulfate (PROAIR RESPICLICK) 366 (90 Base) MCG/ACT AEPB Inhale 2 puffs into the lungs as needed.     ALPRAZolam (XANAX) 1 MG tablet Take 1 mg by mouth at bedtime as needed.     amLODipine (NORVASC) 5 MG tablet Take 5 mg by mouth at bedtime.     clobetasol cream (TEMOVATE) 4.40 % Apply 1 application topically. As needed     Fluticasone-Umeclidin-Vilant (TRELEGY ELLIPTA) 200-62.5-25 MCG/ACT AEPB Inhale 1 puff into the lungs as needed.     Lactobacillus-Inulin (CULTURELLE DIGESTIVE DAILY PO) Take by mouth daily at 6 (six) AM.     levothyroxine (SYNTHROID) 150 MCG tablet Take 150 mcg by mouth daily before breakfast.     torsemide (DEMADEX) 20 MG tablet Take 20 mg by mouth as needed.     triamcinolone cream (KENALOG) 0.1 % Apply 1 application topically as needed.     No current facility-administered medications for this visit.    Review of Systems: GENERAL: negative for malaise, night sweats HEENT: No changes in hearing or vision, no nose bleeds or other nasal problems. NECK: Negative for lumps, goiter, pain and significant neck swelling RESPIRATORY: Negative for cough, wheezing CARDIOVASCULAR: Negative for chest pain, leg swelling, palpitations, orthopnea GI: SEE HPI MUSCULOSKELETAL: Negative for joint pain or swelling, back pain, and muscle pain. SKIN: Negative for lesions, rash PSYCH: Negative for sleep disturbance, mood disorder and recent psychosocial stressors. HEMATOLOGY Negative for prolonged bleeding, bruising easily, and swollen nodes. ENDOCRINE: Negative for cold or heat intolerance, polyuria, polydipsia and goiter. NEURO: negative for tremor, gait imbalance, syncope and seizures. The remainder of the review of systems is noncontributory.   Physical Exam: BP 102/62 (BP Location: Left Arm, Patient Position: Sitting, Cuff Size: Large)    Pulse 66    Temp 98.1 F (36.7 C) (Oral)    Ht 4\' 11"  (1.499 m)    Wt 221 lb 14.4 oz (100.7 kg)    BMI 44.82 kg/m  GENERAL: The patient  is AO x3, in no acute distress. Obese. HEENT: Head is normocephalic and atraumatic. EOMI are intact. Mouth is well hydrated and without lesions. NECK: Supple. No masses LUNGS: Clear to auscultation. No presence of rhonchi/wheezing/rales. Adequate chest expansion HEART: RRR, normal s1 and s2. ABDOMEN: Soft, nontender, no guarding, no peritoneal signs, and nondistended. BS +. No masses. EXTREMITIES: Without any cyanosis, clubbing, rash, lesions or edema. NEUROLOGIC: AOx3, no focal motor deficit. SKIN: no jaundice, no rashes   Imaging/Labs: as above  I personally reviewed and interpreted the available labs, imaging and endoscopic files.  Impression and Plan: Laura Lane is a 66 y.o. female with past medical history of congestive heart failure, COPD, hypothyroidism, myocardial infarction status post CABG, who presents for evaluation of change in bowel movement frequency.  Patient has presented changes in her bowel movements of unclear etiology.  She has not presented any red flag signs.  She is to be more constipated in the past but now is presenting some more frequent bowel movements.  It is  possible that given the chronicity of her symptoms she may have IBS-M but we will evaluate for organic reasons with CBC, CMP, TSH.  No stool testing will be ordered as she reports she had celiac testing ordered in the past.  We will request these records.  As she presented improvement with the use of probiotics, encouraged her to keep taking these medications.  She can take Imodium as needed if she has any recurrent diarrhea.  Will also further characterize her anemia with iron stores.  If the iron stores come back low, we will schedule an EGD on the same day of her colonoscopy.  Finally, we will schedule a colonoscopy for screening purposes, although if she presents persistent diarrhea we may need to perform random colonic biopsies.  - Schedule colonoscopy -Check celiac disease panel, iron studies,  TSH, CMP and CBC - Continue probiotics daily - Can take Imodium as needed if recurrent diarrhea  All questions were answered.      [name], MD Gastroenterology and Hepatology [facility]  ADDENDUM: Received results of blood testing performed on 08/06/2021 which showed BMP with sodium of 139, potassium of 4.4, calcium 9.3, creatinine 1.03, BUN of 19, TSH of 7.0.  This has been followed by her PCP.  Thyroid function should be continued to be managed by his PCP.  We will cancel TSH and CMP testing.

## 2021-09-16 NOTE — Telephone Encounter (Signed)
Meridee Branum Ann Jorryn Casagrande, CMA  ?

## 2021-09-16 NOTE — Telephone Encounter (Signed)
Patient  aware we received the labs from pcp office and Dr. Jenetta Downer took off the duplicates and has reprinted the orders for cbc w diff, Celiac panel, Ferritin, Fe total/Total binding Cap. Patient aware I will place new orders at the front desk for her to pick up. Patient states understanding.

## 2021-09-16 NOTE — Telephone Encounter (Signed)
Laura Lane, CMA  

## 2021-09-16 NOTE — H&P (View-Only) (Signed)
[name], M.D. Gastroenterology & Hepatology [facility] [address] [place], [place] [address] Primary Care Physician: Laura Burly, MD [place] [address]  Referring MD: PCP  Chief Complaint: Change in bowel movement frequency  History of Present Illness: Laura Lane is a 66 y.o. female with past medical history of congestive heart failure, COPD, hypothyroidism, myocardial infarction status post CABG, who presents for evaluation of change in bowel movement frequency.  Patient reports that she has presented a chronic history of fluctuating change in her bowel movements, for at least 20 years. She states she was mostly constipated in the past with occasional episodes of diarrhea intermittently.  She does not elaborate too much regarding the details of her previous symptoms even though I asked her multiple times what kind of symptoms she had in the past.  However, she reports that in February 2022 she had pneumonia and required to have antibiotics. This was followed by more frequent episodes of diarrhea up to 10 bowel movements per day which she describes as frequent little pieces of stool. She also had episodes of fecal urgency previously but no fecal incontinence. Denies any melena or hematochezia. She took Peptobismol but she got scared as her stool turned black.  Has not been taking any other antidiarrheals on a frequent basis although she tried taking Imodium previously.  No nighttime symptoms.  She states that if she eats ice cream she has noticed mucus in her stools with diarrhea. She also has noticed that "sugary meals" lead to these symptoms.  The patient states that she started a probiotic (Culturelle) recently, which she feels has helped in the sense she does not have more tenesmus and frequency of her bowel movements.   The patient denies having any nausea, vomiting, fever, chills, hematochezia,  melena, hematemesis, abdominal distention, abdominal pain, jaundice, pruritus or weight loss.  Upon review of her previous labs from 10/27/2020, she was found to have CBC with hemoglobin of 10.8, MCV of 95, although cell count of 12.2 and platelets 313, BMP showed mild elevation of the creatinine up to 1.27.  No other labs are available.  Last IOX:BDZHG Last Colonoscopy:possibly >10 years ago, no reports are available.  FHx: neg for any gastrointestinal/liver disease, mother breast cancer Social: neg smoking, alcohol or illicit drug use Surgical: no abdominal surgeries  Past Medical History: Past Medical History:  Diagnosis Date   CHF (congestive heart failure) (HCC)    COPD (chronic obstructive pulmonary disease) (HCC)    Hx of CABG    Hypothyroid    MI (myocardial infarction) ([facility])    Renal disorder    Thyroid disease     Past Surgical History: Past Surgical History:  Procedure Laterality Date   CARDIAC SURGERY     CORONARY ARTERY BYPASS GRAFT     INSERTION OF DIALYSIS CATHETER     removal of cyst on labia        TRACHEOSTOMY      Family History: Family History  Problem Relation Age of Onset   Lung disease Father    Breast cancer Mother    Kidney disease Mother    CAD Neg Hx     Social History: Social History   Tobacco Use  Smoking Status Former   Types: Cigarettes   Start date: 08/17/1972   Quit date: 02/06/2014   Years since quitting: 7.6  Smokeless Tobacco Never   Social History   Substance and Sexual Activity  Alcohol Use No   Alcohol/week: 0.0 standard drinks   Social History   Substance and Sexual Activity  Drug Use No    Allergies: Allergies  Allergen Reactions   Prednisone Itching   Zofran [Ondansetron] Nausea And Vomiting and Other (See Comments)    Medications: Current Outpatient Medications  Medication Sig Dispense Refill   acetaminophen (TYLENOL) 650 MG CR tablet Take 650 mg by mouth every 8 (eight) hours as needed for pain.      Albuterol Sulfate (PROAIR RESPICLICK) 833 (90 Base) MCG/ACT AEPB Inhale 2 puffs into the lungs as needed.     ALPRAZolam (XANAX) 1 MG tablet Take 1 mg by mouth at bedtime as needed.     amLODipine (NORVASC) 5 MG tablet Take 5 mg by mouth at bedtime.     clobetasol cream (TEMOVATE) 8.25 % Apply 1 application topically. As needed     Fluticasone-Umeclidin-Vilant (TRELEGY ELLIPTA) 200-62.5-25 MCG/ACT AEPB Inhale 1 puff into the lungs as needed.     Lactobacillus-Inulin (CULTURELLE DIGESTIVE DAILY PO) Take by mouth daily at 6 (six) AM.     levothyroxine (SYNTHROID) 150 MCG tablet Take 150 mcg by mouth daily before breakfast.     torsemide (DEMADEX) 20 MG tablet Take 20 mg by mouth as needed.     triamcinolone cream (KENALOG) 0.1 % Apply 1 application topically as needed.     No current facility-administered medications for this visit.    Review of Systems: GENERAL: negative for malaise, night sweats HEENT: No changes in hearing or vision, no nose bleeds or other nasal problems. NECK: Negative for lumps, goiter, pain and significant neck swelling RESPIRATORY: Negative for cough, wheezing CARDIOVASCULAR: Negative for chest pain, leg swelling, palpitations, orthopnea GI: SEE HPI MUSCULOSKELETAL: Negative for joint pain or swelling, back pain, and muscle pain. SKIN: Negative for lesions, rash PSYCH: Negative for sleep disturbance, mood disorder and recent psychosocial stressors. HEMATOLOGY Negative for prolonged bleeding, bruising easily, and swollen nodes. ENDOCRINE: Negative for cold or heat intolerance, polyuria, polydipsia and goiter. NEURO: negative for tremor, gait imbalance, syncope and seizures. The remainder of the review of systems is noncontributory.   Physical Exam: BP 102/62 (BP Location: Left Arm, Patient Position: Sitting, Cuff Size: Large)    Pulse 66    Temp 98.1 F (36.7 C) (Oral)    Ht 4\' 11"  (1.499 m)    Wt 221 lb 14.4 oz (100.7 kg)    BMI 44.82 kg/m  GENERAL: The patient  is AO x3, in no acute distress. Obese. HEENT: Head is normocephalic and atraumatic. EOMI are intact. Mouth is well hydrated and without lesions. NECK: Supple. No masses LUNGS: Clear to auscultation. No presence of rhonchi/wheezing/rales. Adequate chest expansion HEART: RRR, normal s1 and s2. ABDOMEN: Soft, nontender, no guarding, no peritoneal signs, and nondistended. BS +. No masses. EXTREMITIES: Without any cyanosis, clubbing, rash, lesions or edema. NEUROLOGIC: AOx3, no focal motor deficit. SKIN: no jaundice, no rashes   Imaging/Labs: as above  I personally reviewed and interpreted the available labs, imaging and endoscopic files.  Impression and Plan: Laura Lane is a 66 y.o. female with past medical history of congestive heart failure, COPD, hypothyroidism, myocardial infarction status post CABG, who presents for evaluation of change in bowel movement frequency.  Patient has presented changes in her bowel movements of unclear etiology.  She has not presented any red flag signs.  She is to be more constipated in the past but now is presenting some more frequent bowel movements.  It is  possible that given the chronicity of her symptoms she may have IBS-M but we will evaluate for organic reasons with CBC, CMP, TSH.  No stool testing will be ordered as she reports she had celiac testing ordered in the past.  We will request these records.  As she presented improvement with the use of probiotics, encouraged her to keep taking these medications.  She can take Imodium as needed if she has any recurrent diarrhea.  Will also further characterize her anemia with iron stores.  If the iron stores come back low, we will schedule an EGD on the same day of her colonoscopy.  Finally, we will schedule a colonoscopy for screening purposes, although if she presents persistent diarrhea we may need to perform random colonic biopsies.  - Schedule colonoscopy -Check celiac disease panel, iron studies,  TSH, CMP and CBC - Continue probiotics daily - Can take Imodium as needed if recurrent diarrhea  All questions were answered.      [name], MD Gastroenterology and Hepatology [facility]  ADDENDUM: Received results of blood testing performed on 08/06/2021 which showed BMP with sodium of 139, potassium of 4.4, calcium 9.3, creatinine 1.03, BUN of 19, TSH of 7.0.  This has been followed by her PCP.  Thyroid function should be continued to be managed by his PCP.  We will cancel TSH and CMP testing.

## 2021-09-17 LAB — CBC WITH DIFFERENTIAL/PLATELET
Absolute Monocytes: 775 cells/uL (ref 200–950)
Basophils Absolute: 84 cells/uL (ref 0–200)
Basophils Relative: 1.1 %
Eosinophils Absolute: 190 cells/uL (ref 15–500)
Eosinophils Relative: 2.5 %
HCT: 39.8 % (ref 35.0–45.0)
Hemoglobin: 13.1 g/dL (ref 11.7–15.5)
Lymphs Abs: 2212 cells/uL (ref 850–3900)
MCH: 30.5 pg (ref 27.0–33.0)
MCHC: 32.9 g/dL (ref 32.0–36.0)
MCV: 92.6 fL (ref 80.0–100.0)
MPV: 11.7 fL (ref 7.5–12.5)
Monocytes Relative: 10.2 %
Neutro Abs: 4340 cells/uL (ref 1500–7800)
Neutrophils Relative %: 57.1 %
Platelets: 306 10*3/uL (ref 140–400)
RBC: 4.3 10*6/uL (ref 3.80–5.10)
RDW: 13 % (ref 11.0–15.0)
Total Lymphocyte: 29.1 %
WBC: 7.6 10*3/uL (ref 3.8–10.8)

## 2021-09-17 LAB — CELIAC DISEASE PANEL
(tTG) Ab, IgA: <1 U/mL
(tTG) Ab, IgG: 1 U/mL
Gliadin IgA: 2.2 U/mL
Gliadin IgG: 90.2 U/mL — ABNORMAL HIGH
Immunoglobulin A: 254 mg/dL (ref 70–320)

## 2021-09-17 LAB — IRON, TOTAL/TOTAL IRON BINDING CAP
%SAT: 22 % (calc) (ref 16–45)
Iron: 70 ug/dL (ref 45–160)
TIBC: 315 mcg/dL (calc) (ref 250–450)

## 2021-09-17 LAB — FERRITIN: Ferritin: 32 ng/mL (ref 16–288)

## 2021-09-18 ENCOUNTER — Other Ambulatory Visit (INDEPENDENT_AMBULATORY_CARE_PROVIDER_SITE_OTHER): Payer: Self-pay

## 2021-09-18 DIAGNOSIS — Z1211 Encounter for screening for malignant neoplasm of colon: Secondary | ICD-10-CM

## 2021-09-18 DIAGNOSIS — R194 Change in bowel habit: Secondary | ICD-10-CM

## 2021-09-20 ENCOUNTER — Encounter (INDEPENDENT_AMBULATORY_CARE_PROVIDER_SITE_OTHER): Payer: Self-pay

## 2021-09-30 ENCOUNTER — Telehealth (INDEPENDENT_AMBULATORY_CARE_PROVIDER_SITE_OTHER): Payer: Self-pay

## 2021-09-30 MED ORDER — PEG 3350-KCL-NA BICARB-NACL 420 G PO SOLR: 4000.0000 mL | ORAL | 0 refills | Status: DC

## 2021-09-30 NOTE — Telephone Encounter (Signed)
Ally Knodel Ann Novie Maggio, CMA  ?

## 2021-10-09 ENCOUNTER — Other Ambulatory Visit (INDEPENDENT_AMBULATORY_CARE_PROVIDER_SITE_OTHER): Payer: Self-pay

## 2021-10-09 DIAGNOSIS — Z01812 Encounter for preprocedural laboratory examination: Secondary | ICD-10-CM

## 2021-10-14 ENCOUNTER — Other Ambulatory Visit (HOSPITAL_COMMUNITY)
Admission: RE | Admit: 2021-10-14 | Discharge: 2021-10-14 | Disposition: A | Discharge status: Home / Self Care | Payer: 59 | Source: Ambulatory Visit | Attending: Gastroenterology | Admitting: Gastroenterology

## 2021-10-14 ENCOUNTER — Other Ambulatory Visit (INDEPENDENT_AMBULATORY_CARE_PROVIDER_SITE_OTHER): Payer: Self-pay

## 2021-10-14 DIAGNOSIS — Z01812 Encounter for preprocedural laboratory examination: Secondary | ICD-10-CM | POA: Diagnosis not present

## 2021-10-14 LAB — BASIC METABOLIC PANEL
Anion gap: 5 (ref 5–15)
BUN: 19 mg/dL (ref 8–23)
CO2: 29 mmol/L (ref 22–32)
Calcium: 9.2 mg/dL (ref 8.9–10.3)
Chloride: 108 mmol/L (ref 98–111)
Creatinine, Ser: 1.32 mg/dL — ABNORMAL HIGH (ref 0.44–1.00)
GFR, Estimated: 45 mL/min — ABNORMAL LOW (ref >60)
Glucose, Bld: 108 mg/dL — ABNORMAL HIGH (ref 70–99)
Potassium: 4.4 mmol/L (ref 3.5–5.1)
Sodium: 142 mmol/L (ref 135–145)

## 2021-10-16 ENCOUNTER — Ambulatory Visit (HOSPITAL_COMMUNITY)
Admission: RE | Admit: 2021-10-16 | Discharge: 2021-10-16 | Disposition: A | Discharge status: Home / Self Care | Payer: 59 | Attending: Gastroenterology | Admitting: Gastroenterology

## 2021-10-16 ENCOUNTER — Other Ambulatory Visit: Payer: Self-pay

## 2021-10-16 ENCOUNTER — Ambulatory Visit (HOSPITAL_COMMUNITY): Payer: 59 | Admitting: Anesthesiology

## 2021-10-16 ENCOUNTER — Encounter (HOSPITAL_COMMUNITY): Payer: Self-pay | Admitting: Gastroenterology

## 2021-10-16 ENCOUNTER — Encounter (HOSPITAL_COMMUNITY)
Admission: RE | Disposition: A | Discharge status: Home / Self Care | Payer: Self-pay | Source: Home / Self Care | Attending: Gastroenterology

## 2021-10-16 DIAGNOSIS — Z6841 Body Mass Index (BMI) 40.0 and over, adult: Secondary | ICD-10-CM | POA: Diagnosis not present

## 2021-10-16 DIAGNOSIS — K5730 Diverticulosis of large intestine without perforation or abscess without bleeding: Secondary | ICD-10-CM | POA: Diagnosis not present

## 2021-10-16 DIAGNOSIS — K635 Polyp of colon: Secondary | ICD-10-CM

## 2021-10-16 DIAGNOSIS — Z9981 Dependence on supplemental oxygen: Secondary | ICD-10-CM | POA: Diagnosis not present

## 2021-10-16 DIAGNOSIS — Z951 Presence of aortocoronary bypass graft: Secondary | ICD-10-CM | POA: Diagnosis not present

## 2021-10-16 DIAGNOSIS — I509 Heart failure, unspecified: Secondary | ICD-10-CM | POA: Insufficient documentation

## 2021-10-16 DIAGNOSIS — D649 Anemia, unspecified: Secondary | ICD-10-CM | POA: Diagnosis not present

## 2021-10-16 DIAGNOSIS — I252 Old myocardial infarction: Secondary | ICD-10-CM | POA: Insufficient documentation

## 2021-10-16 DIAGNOSIS — J449 Chronic obstructive pulmonary disease, unspecified: Secondary | ICD-10-CM | POA: Insufficient documentation

## 2021-10-16 DIAGNOSIS — E039 Hypothyroidism, unspecified: Secondary | ICD-10-CM | POA: Diagnosis not present

## 2021-10-16 DIAGNOSIS — Z87891 Personal history of nicotine dependence: Secondary | ICD-10-CM | POA: Insufficient documentation

## 2021-10-16 DIAGNOSIS — K648 Other hemorrhoids: Secondary | ICD-10-CM | POA: Insufficient documentation

## 2021-10-16 DIAGNOSIS — R194 Change in bowel habit: Secondary | ICD-10-CM

## 2021-10-16 DIAGNOSIS — I2510 Atherosclerotic heart disease of native coronary artery without angina pectoris: Secondary | ICD-10-CM | POA: Diagnosis not present

## 2021-10-16 DIAGNOSIS — K644 Residual hemorrhoidal skin tags: Secondary | ICD-10-CM | POA: Diagnosis not present

## 2021-10-16 DIAGNOSIS — K3189 Other diseases of stomach and duodenum: Secondary | ICD-10-CM | POA: Diagnosis not present

## 2021-10-16 DIAGNOSIS — R197 Diarrhea, unspecified: Secondary | ICD-10-CM

## 2021-10-16 DIAGNOSIS — K449 Diaphragmatic hernia without obstruction or gangrene: Secondary | ICD-10-CM

## 2021-10-16 DIAGNOSIS — D123 Benign neoplasm of transverse colon: Secondary | ICD-10-CM | POA: Diagnosis not present

## 2021-10-16 DIAGNOSIS — K2100 Gastro-esophageal reflux disease with esophagitis, without bleeding: Secondary | ICD-10-CM | POA: Diagnosis not present

## 2021-10-16 DIAGNOSIS — Z1211 Encounter for screening for malignant neoplasm of colon: Secondary | ICD-10-CM

## 2021-10-16 DIAGNOSIS — K259 Gastric ulcer, unspecified as acute or chronic, without hemorrhage or perforation: Secondary | ICD-10-CM | POA: Diagnosis not present

## 2021-10-16 HISTORY — PX: BIOPSY: SHX5522

## 2021-10-16 HISTORY — PX: COLONOSCOPY WITH PROPOFOL: SHX5780

## 2021-10-16 HISTORY — PX: POLYPECTOMY: SHX5525

## 2021-10-16 HISTORY — DX: Unspecified cataract: H26.9

## 2021-10-16 HISTORY — PX: ESOPHAGOGASTRODUODENOSCOPY (EGD) WITH PROPOFOL: SHX5813

## 2021-10-16 LAB — HM COLONOSCOPY

## 2021-10-16 SURGERY — COLONOSCOPY WITH PROPOFOL
Anesthesia: General

## 2021-10-16 MED ORDER — PROPOFOL 500 MG/50ML IV EMUL
INTRAVENOUS | Status: DC | PRN
  Administered 2021-10-16: 150 ug/kg/min via INTRAVENOUS

## 2021-10-16 MED ORDER — PHENYLEPHRINE 40 MCG/ML (10ML) SYRINGE FOR IV PUSH (FOR BLOOD PRESSURE SUPPORT)
PREFILLED_SYRINGE | INTRAVENOUS | Status: DC | PRN
  Administered 2021-10-16 (×3): 80 ug via INTRAVENOUS

## 2021-10-16 MED ORDER — PHENYLEPHRINE 40 MCG/ML (10ML) SYRINGE FOR IV PUSH (FOR BLOOD PRESSURE SUPPORT)
PREFILLED_SYRINGE | INTRAVENOUS | Status: AC
  Filled 2021-10-16: qty 10

## 2021-10-16 MED ORDER — LACTATED RINGERS IV SOLN: INTRAVENOUS | Status: DC

## 2021-10-16 MED ORDER — LIDOCAINE HCL (CARDIAC) PF 100 MG/5ML IV SOSY
PREFILLED_SYRINGE | INTRAVENOUS | Status: DC | PRN
  Administered 2021-10-16: 50 mg via INTRAVENOUS

## 2021-10-16 MED ORDER — PROPOFOL 10 MG/ML IV BOLUS
INTRAVENOUS | Status: DC | PRN
  Administered 2021-10-16: 50 mg via INTRAVENOUS
  Administered 2021-10-16: 100 mg via INTRAVENOUS
  Administered 2021-10-16: 40 mg via INTRAVENOUS

## 2021-10-16 MED ORDER — OMEPRAZOLE 20 MG PO CPDR: 20.0000 mg | DELAYED_RELEASE_CAPSULE | Freq: Every day | ORAL | 3 refills | Status: DC

## 2021-10-16 NOTE — Anesthesia Procedure Notes (Signed)
Date/Time: 10/16/2021 11:02 AM Performed by: Orlie Dakin, CRNA Pre-anesthesia Checklist: Patient identified, Emergency Drugs available, Patient being monitored and Suction available Patient Re-evaluated:Patient Re-evaluated prior to induction Oxygen Delivery Method: Nasal cannula Induction Type: IV induction Placement Confirmation: positive ETCO2

## 2021-10-16 NOTE — Interval H&P Note (Signed)
History and Physical Interval Note:  10/16/2021 9:06 AM  The patient has presented intermittent changes in her bowel movements that she has had diarrhea and constipation on and off.  She had positive antigliadin IgG titers recently.  [name]  has presented today for surgery, with the diagnosis of Screening Colonoscopy elevated celiac markers change in bowel movement habits.  The various methods of treatment have been discussed with the patient and family. After consideration of risks, benefits and other options for treatment, the patient has consented to  Procedure(s) with comments: COLONOSCOPY WITH PROPOFOL (N/A) - 1245 ESOPHAGOGASTRODUODENOSCOPY (EGD) WITH PROPOFOL (N/A) as a surgical intervention.  The patient's history has been reviewed, patient examined, no change in status, stable for surgery.  I have reviewed the patient's chart and labs.  Questions were answered to the patient's satisfaction.     Laura Lane

## 2021-10-16 NOTE — Discharge Instructions (Addendum)
You are being discharged to home.  Resume your previous diet.  We are waiting for your pathology results.  Start omeprazole 20 mg qday indefinitely. Your physician has recommended a repeat colonoscopy for surveillance based on pathology results.

## 2021-10-16 NOTE — Transfer of Care (Signed)
Immediate Anesthesia Transfer of Care Note  Patient: Laura Lane  Procedure(s) Performed: COLONOSCOPY WITH PROPOFOL ESOPHAGOGASTRODUODENOSCOPY (EGD) WITH PROPOFOL BIOPSY POLYPECTOMY  Patient Location: Endoscopy Unit  Anesthesia Type:General  Level of Consciousness: awake  Airway & Oxygen Therapy: Patient Spontanous Breathing  Post-op Assessment: Report given to RN and Post -op Vital signs reviewed and stable  Post vital signs: Reviewed and stable  Last Vitals:  Vitals Value Taken Time  BP    Temp    Pulse 74   Resp 18   SpO2 96%     Last Pain:  Vitals:   10/16/21 1055  TempSrc:   PainSc: 0-No pain      Patients Stated Pain Goal: 7 (00/34/96 1164)  Complications: No notable events documented.

## 2021-10-16 NOTE — Op Note (Signed)
[facility] Patient Name: Laura Lane Procedure Date: 10/16/2021 10:46 AM MRN: [id number] Date of Birth: Oct 17, 1955 Attending MD: [name] ,  CSN: [id number] Age: 66 Admit Type: Outpatient Procedure:                Upper GI endoscopy Indications:              Diarrhea, Positive anti gliadin ab Providers:                [name], Caprice Kluver, Raphael Gibney,                            Technician Referring MD:              Medicines:                Monitored Anesthesia Care Complications:            No immediate complications. Estimated Blood Loss:     Estimated blood loss: none. Procedure:                Pre-Anesthesia Assessment:                           - Prior to the procedure, a History and Physical                            was performed, and patient medications, allergies                            and sensitivities were reviewed. The patient's                            tolerance of previous anesthesia was reviewed.                           - The risks and benefits of the procedure and the                            sedation options and risks were discussed with the                            patient. All questions were answered and informed                            consent was obtained.                           - ASA Grade Assessment: II - A patient with mild                            systemic disease.                           After obtaining informed consent, the endoscope was                            passed under direct vision. Throughout the  procedure, the patient's blood pressure, pulse, and                            oxygen saturations were monitored continuously. The                            [id number]-[id number] ([id number]) scope was introduced through the                            mouth, and advanced to the second part of duodenum.                            The upper GI endoscopy was accomplished without                             difficulty. The patient tolerated the procedure                            well. Scope In: 11:01:13 AM Scope Out: 11:09:25 AM Total Procedure Duration: 0 hours 8 minutes 12 seconds  Findings:      LA Grade A (one or more mucosal breaks less than 5 mm, not extending       between tops of 2 mucosal folds) esophagitis with no bleeding was found       at the gastroesophageal junction.      A 4 cm hiatal hernia with a few Cameron ulcers was found.      The entire examined stomach was normal.      Diffuse mildly congested mucosa without active bleeding and with no       stigmata of bleeding was found in the first portion of the duodenum and       in the second portion of the duodenum. Biopsies x10 were taken with a       cold forceps for histology. Impression:               - LA Grade A reflux esophagitis with no bleeding.                           - 4 cm hiatal hernia with a few Cameron ulcers.                           - Normal stomach.                           - Congested duodenal mucosa. Biopsied. Moderate Sedation:      Per Anesthesia Care Recommendation:           - Discharge patient to home (ambulatory).                           - Resume previous diet.                           - Await pathology results.                           - Start omeprazole 20  mg qday indefinitely. Procedure Code(s):        --- Professional ---                           [phone], Esophagogastroduodenoscopy, flexible,                            transoral; with biopsy, single or multiple Diagnosis Code(s):        --- Professional ---                           K21.00, Gastro-esophageal reflux disease with                            esophagitis, without bleeding                           K44.9, Diaphragmatic hernia without obstruction or                            gangrene                           K25.9, Gastric ulcer, unspecified as acute or                            chronic, without hemorrhage or  perforation                           K31.89, Other diseases of stomach and duodenum                           R19.7, Diarrhea, unspecified CPT copyright 2019 American Medical Association. All rights reserved. The codes documented in this report are preliminary and upon coder review may  be revised to meet current compliance requirements. [name], MD [name],  10/16/2021 11:40:49 AM This report has been signed electronically. Number of Addenda: 0

## 2021-10-16 NOTE — Anesthesia Preprocedure Evaluation (Addendum)
Anesthesia Evaluation  Patient identified by MRN, date of birth, ID band Patient awake    Reviewed: Allergy & Precautions, H&P , NPO status , Patient's Chart, lab work & pertinent test results, reviewed documented beta blocker date and time   Airway Mallampati: II  TM Distance: >3 FB Neck ROM: full    Dental  (+) Dental Advisory Given, Missing, Caps   Pulmonary COPD,  oxygen dependent, former smoker,    Pulmonary exam normal breath sounds clear to auscultation       Cardiovascular Exercise Tolerance: Good + CAD, + Past MI and + CABG  + Valvular Problems/Murmurs MR  Rhythm:regular Rate:Normal     Neuro/Psych negative neurological ROS  negative psych ROS   GI/Hepatic negative GI ROS, Neg liver ROS,   Endo/Other  Hypothyroidism Morbid obesity  Renal/GU negative Renal ROS  negative genitourinary   Musculoskeletal   Abdominal   Peds  Hematology  (+) Blood dyscrasia, anemia ,   Anesthesia Other Findings CABG x 2 2015 Stable CAD Preserved EF  Reproductive/Obstetrics negative OB ROS                           Anesthesia Physical Anesthesia Plan  ASA: 2  Anesthesia Plan: General   Post-op Pain Management:    Induction:   PONV Risk Score and Plan: Propofol infusion  Airway Management Planned:   Additional Equipment:   Intra-op Plan:   Post-operative Plan:   Informed Consent: I have reviewed the patients History and Physical, chart, labs and discussed the procedure including the risks, benefits and alternatives for the proposed anesthesia with the patient or authorized representative who has indicated his/her understanding and acceptance.     Dental Advisory Given  Plan Discussed with: CRNA  Anesthesia Plan Comments:         Anesthesia Quick Evaluation

## 2021-10-16 NOTE — Op Note (Signed)
[facility] Patient Name: Laura Lane Procedure Date: 10/16/2021 10:47 AM MRN: [id number] Date of Birth: 06-Aug-1956 Attending MD: [name] ,  CSN: [id number] Age: 65 Admit Type: Outpatient Procedure:                Colonoscopy Indications:              Change in bowel habits Providers:                [name], Caprice Kluver, Raphael Gibney,                            Technician Referring MD:              Medicines:                Monitored Anesthesia Care Complications:            No immediate complications. Estimated Blood Loss:     Estimated blood loss: none. Procedure:                Pre-Anesthesia Assessment:                           - Prior to the procedure, a History and Physical                            was performed, and patient medications, allergies                            and sensitivities were reviewed. The patient's                            tolerance of previous anesthesia was reviewed.                           - The risks and benefits of the procedure and the                            sedation options and risks were discussed with the                            patient. All questions were answered and informed                            consent was obtained.                           - ASA Grade Assessment: II - A patient with mild                            systemic disease.                           After obtaining informed consent, the colonoscope                            was passed under direct vision. Throughout the  procedure, the patient's blood pressure, pulse, and                            oxygen saturations were monitored continuously. The                            [id number]-[id number] ([id number]) scope was introduced through                            the anus and advanced to the the cecum, identified                            by appendiceal orifice and ileocecal valve. The                             colonoscopy was performed without difficulty. The                            patient tolerated the procedure well. The quality                            of the bowel preparation was good. Scope In: 11:12:28 AM Scope Out: 11:37:33 AM Scope Withdrawal Time: 0 hours 14 minutes 19 seconds  Total Procedure Duration: 0 hours 25 minutes 5 seconds  Findings:      Hemorrhoids were found on perianal exam.      A 4 mm polyp was found in the transverse colon. The polyp was sessile.       The polyp was removed with a cold snare. Resection and retrieval were       complete.      A few small-mouthed diverticula were found in the sigmoid colon.       Biopsies for histology were taken from the normal colon with a cold       forceps from the right colon and left colon for evaluation of       microscopic colitis.      External internal hemorrhoids were found during retroflexion. The       hemorrhoids were medium-sized. Impression:               - Hemorrhoids found on perianal exam.                           - One 4 mm polyp in the transverse colon, removed                            with a cold snare. Resected and retrieved.                           - Diverticulosis in the sigmoid colon. Biopsied.                           - External internal hemorrhoids. Moderate Sedation:      Per Anesthesia Care Recommendation:           - Discharge patient to home (ambulatory).                           -  Resume previous diet.                           - Await pathology results.                           - Repeat colonoscopy for surveillance based on                            pathology results. Procedure Code(s):        --- Professional ---                           [phone], Colonoscopy, flexible; with removal of                            tumor(s), polyp(s), or other lesion(s) by snare                            technique                           45380, 5, Colonoscopy, flexible; with biopsy,                             single or multiple Diagnosis Code(s):        --- Professional ---                           K64.4, Residual hemorrhoidal skin tags                           K64.8, Other hemorrhoids                           K63.5, Polyp of colon                           R19.4, Change in bowel habit                           K57.30, Diverticulosis of large intestine without                            perforation or abscess without bleeding CPT copyright 2019 American Medical Association. All rights reserved. The codes documented in this report are preliminary and upon coder review may  be revised to meet current compliance requirements. [name], MD [name],  10/16/2021 11:44:54 AM This report has been signed electronically. Number of Addenda: 0

## 2021-10-17 ENCOUNTER — Encounter (INDEPENDENT_AMBULATORY_CARE_PROVIDER_SITE_OTHER): Payer: Self-pay | Admitting: *Deleted

## 2021-10-17 LAB — SURGICAL PATHOLOGY

## 2021-10-17 NOTE — Anesthesia Postprocedure Evaluation (Signed)
Anesthesia Post Note  Patient: Laura Lane  Procedure(s) Performed: COLONOSCOPY WITH PROPOFOL ESOPHAGOGASTRODUODENOSCOPY (EGD) WITH PROPOFOL BIOPSY POLYPECTOMY  Patient location during evaluation: Phase II Anesthesia Type: General Level of consciousness: awake Pain management: pain level controlled Vital Signs Assessment: post-procedure vital signs reviewed and stable Respiratory status: spontaneous breathing and respiratory function stable Cardiovascular status: blood pressure returned to baseline and stable Postop Assessment: no headache and no apparent nausea or vomiting Anesthetic complications: no Comments: Late entry   No notable events documented.   Last Vitals:  Vitals:   10/16/21 1140 10/16/21 1144  BP: (!) 87/48 (!) 94/52  Pulse: 74 72  Resp:  18  Temp: (!) 36.4 C   SpO2: 96% 96%    Last Pain:  Vitals:   10/16/21 1144  TempSrc:   PainSc: 0-No pain                 Louann Sjogren

## 2021-10-18 ENCOUNTER — Encounter (HOSPITAL_COMMUNITY): Payer: Self-pay | Admitting: Gastroenterology

## 2022-01-13 ENCOUNTER — Encounter (INDEPENDENT_AMBULATORY_CARE_PROVIDER_SITE_OTHER): Payer: Self-pay | Admitting: Gastroenterology

## 2022-01-13 ENCOUNTER — Ambulatory Visit (INDEPENDENT_AMBULATORY_CARE_PROVIDER_SITE_OTHER): Payer: 59 | Admitting: Gastroenterology

## 2022-01-13 DIAGNOSIS — K449 Diaphragmatic hernia without obstruction or gangrene: Secondary | ICD-10-CM | POA: Diagnosis not present

## 2022-01-13 DIAGNOSIS — K209 Esophagitis, unspecified without bleeding: Secondary | ICD-10-CM

## 2022-01-13 DIAGNOSIS — K257 Chronic gastric ulcer without hemorrhage or perforation: Secondary | ICD-10-CM | POA: Diagnosis not present

## 2022-01-13 DIAGNOSIS — K900 Celiac disease: Secondary | ICD-10-CM

## 2022-01-13 MED ORDER — FAMOTIDINE 40 MG PO TABS: 40.0000 mg | ORAL_TABLET | Freq: Every day | ORAL | 3 refills | Status: DC

## 2022-01-13 NOTE — Patient Instructions (Signed)
Discuss with pharmacist which medications have gluten, discuss with PCP to switch to a non-gluten option ?Gluten free diet ?Perform blood workup ?Repeat EGD in 10/2023 ?Start famotidine 40 mg qday ?

## 2022-01-13 NOTE — Progress Notes (Signed)
[name], M.D. ?Gastroenterology & Hepatology ?[facility] ?[address] ?[place],  [address] ? ?Primary Care Physician: ?Neale Burly, MD ?[address] ?[place] [address] ? ?I will communicate my assessment and recommendations to the referring MD via EMR. ? ?Problems: ?Celiac disease ?GERD ? ?History of Present Illness: ?Laura Lane is a 66 y.o. female with past medical history of congestive heart failure, COPD, hypothyroidism, myocardial infarction status post CABG, celiac disease, who presents for follow up of celiac disease and GERD. ? ?The patient was last seen on 09/16/2021. At that time, the patient had celiac disease checked which showed elevated gliadin IgG with most recent hemoglobin of 13.1 and MCV of 92.  Iron stores were also within normal limits.  Underwent egd and colonoscopy with findings described below.  Patient was started on omeprazole 20 mg every day.  She was also advised to implement a gluten-free diet and was referred to nutrition. States that amlodipine may have slightly increased her diarrhea since then. ? ?She did not start the omeprazole as she had concern for breast cancer as her mother developed breast cancer in the past. ? ?She did not follow with the nutritionist as she thought that she could make the dietary changes on her own. She states she read the labels of food compliantly. She uses her own utensils for cooking. She states that she is concerned as some of her medications may not be gluten free. Overall she feels better as her diarrhea has improved as sometimes her stool is formed or soft, she is having 4-5 times per day without melena or hematochezia. The patient denies having any nausea, vomiting, fever, hematochezia, melena, hematemesis, abdominal distention, abdominal pain,  jaundice, pruritus or weight loss. ? ?Last EGD: 10/2021 ?- LA Grade A reflux esophagitis with no bleeding. ?- 4 cm hiatal  hernia with a few Cameron ulcers. ?- Normal stomach. ?- Congested duodenal mucosa. Biopsied. ? ?Path: ?A. DUODENUM, BIOPSY:  ?-  Benign duodenal mucosa with focal increased intraepithelial  ?lymphocytes  ?-  No acute inflammation or villous blunting identified  ? ?Repeat EGD in 2 years. ? ?Last Colonoscopy: 10/2021 ?Hemorrhoids were found on perianal exam. ?A 4 mm polyp was found in the transverse colon. The polyp was sessile. The polyp was removed with a ?cold snare. Resection and retrieval were complete. ?A few small-mouthed diverticula were found in the sigmoid colon. Biopsies for histology were taken from ?the normal colon with a cold forceps from the right colon and left colon for evaluation of microscopic ?colitis. ?External internal hemorrhoids were found during retroflexion. The hemorrhoids were medium-sized. ? ?Path: ?B. COLON, TRANSVERSE, POLYPECTOMY:  ?-  Tubular adenoma (1 of 1 fragments)  ?-  No high-grade dysplasia or malignancy identified  ? ?C. COLON, RANDOM, BIOPSY:  ?-  Benign colonic mucosa  ?-  No active inflammation or evidence of microscopic colitis  ?-  No high-grade dysplasia or malignancy identified  ? ?Past Medical History: ?Past Medical History:  ?Diagnosis Date  ? Cataracts, bilateral   ? CHF (congestive heart failure) ([place])   ? COPD (chronic obstructive pulmonary disease) ([place])   ? Hx of CABG   ? Hypothyroid   ? MI (myocardial infarction) ([place])   ? Renal disorder   ? Thyroid disease   ? ? ?Past Surgical History: ?Past Surgical History:  ?Procedure Laterality Date  ? BIOPSY  10/16/2021  ? Procedure: BIOPSY;  Surgeon: Harvel Quale, MD;  [facility];  Service: Gastroenterology;;  ? CARDIAC SURGERY    ? CATARACT EXTRACTION Bilateral   ? COLONOSCOPY WITH PROPOFOL N/A 10/16/2021  ? Procedure: COLONOSCOPY WITH PROPOFOL;  Surgeon: Harvel Quale, MD;  [facility];  Service: Gastroenterology;  Laterality: N/A;  1245  ? CORONARY ARTERY BYPASS GRAFT    ?  ESOPHAGOGASTRODUODENOSCOPY (EGD) WITH PROPOFOL N/A 10/16/2021  ? Procedure: ESOPHAGOGASTRODUODENOSCOPY (EGD) WITH PROPOFOL;  Surgeon: Harvel Quale, MD;  [facility];  Service: Gastroenterology;  Laterality: N/A;  ? GASTROSTOMY W/ FEEDING TUBE    ? INSERTION OF DIALYSIS CATHETER    ? POLYPECTOMY  10/16/2021  ? Procedure: POLYPECTOMY;  Surgeon: Montez Morita, Quillian Quince, MD;  [facility];  Service: Gastroenterology;;  ? removal of cyst on labia       ? TRACHEOSTOMY    ? ? ?Family History: ?Family History  ?Problem Relation Age of Onset  ? Lung disease Father   ? Breast cancer Mother   ? Kidney disease Mother   ? CAD Neg Hx   ? ? ?Social History: ?Social History  ? ?Tobacco Use  ?Smoking Status Former  ? Types: Cigarettes  ? Start date: 08/17/1972  ? Quit date: 02/06/2014  ? Years since quitting: 7.9  ? Passive exposure: Past  ?Smokeless Tobacco Never  ? ?Social History  ? ?Substance and Sexual Activity  ?Alcohol Use No  ? Alcohol/week: 0.0 standard drinks  ? ?Social History  ? ?Substance and Sexual Activity  ?Drug Use No  ? ? ?Allergies: ?Allergies  ?Allergen Reactions  ? Losartan Other (See Comments)  ?  Unknown reaction.   ? Prednisone Itching  ? Zofran [Ondansetron] Nausea And Vomiting and Other (See Comments)  ?  headaches  ? ? ?Medications: ?Current Outpatient Medications  ?Medication Sig Dispense Refill  ? acetaminophen (TYLENOL) 650 MG CR tablet Take 650-1,300 mg by mouth every 8 (eight) hours as needed for pain.    ? Albuterol Sulfate (PROAIR RESPICLICK) 001 (90 Base) MCG/ACT AEPB Inhale 2 puffs into the lungs every 6 (six) hours as needed (shortness of breath).    ? ALPRAZolam (XANAX) 1 MG tablet Take 1 mg by mouth at bedtime.    ? amLODipine (NORVASC) 5 MG tablet Take 5 mg by mouth at bedtime.    ? clobetasol cream (TEMOVATE) 7.49 % Apply 1 application topically daily as needed (psoriasis).    ? Cyanocobalamin (B-12 PO) Take by mouth. Thorne B!@ '1mg'$  once daily    ? Fluticasone  Propionate, Inhal, 250 MCG/ACT AEPB Inhale into the lungs in the morning and at bedtime.    ? Lactobacillus-Inulin (CULTURELLE DIGESTIVE DAILY PO) Take 1 capsule by mouth daily at 6 (six) AM.    ? levothyroxine (SYNTHROID) 150 MCG tablet Take 150 mcg by mouth daily before breakfast.    ? methocarbamol (ROBAXIN) 500 MG tablet Take 500 mg by mouth daily as needed for muscle spasms.    ? omeprazole (PRILOSEC) 20 MG capsule Take 1 capsule (20 mg total) by mouth daily. 90 capsule 3  ? OVER THE COUNTER MEDICATION Calcium citrate plus D3 '630mg'$ /500Iu    ? torsemide (DEMADEX) 20 MG tablet Take 20 mg by mouth daily as needed (swelling).    ? triamcinolone cream (KENALOG) 0.1 % Apply 1 application topically daily as needed (itching).    ? ?No current facility-administered medications for this visit.  ? ? ?Review of Systems: ?GENERAL: negative for malaise, night sweats ?HEENT: No changes in hearing or vision, no  nose bleeds or other nasal problems. ?NECK: Negative for lumps, goiter, pain and significant neck swelling ?RESPIRATORY: Negative for cough, wheezing ?CARDIOVASCULAR: Negative for chest pain, leg swelling, palpitations, orthopnea ?GI: SEE HPI ?MUSCULOSKELETAL: Negative for joint pain or swelling, back pain, and muscle pain. ?SKIN: Negative for lesions, rash ?PSYCH: Negative for sleep disturbance, mood disorder and recent psychosocial stressors. ?HEMATOLOGY Negative for prolonged bleeding, bruising easily, and swollen nodes. ?ENDOCRINE: Negative for cold or heat intolerance, polyuria, polydipsia and goiter. ?NEURO: negative for tremor, gait imbalance, syncope and seizures. ?The remainder of the review of systems is noncontributory. ? ? ?Physical Exam: ?There were no vitals taken for this visit. ?GENERAL: The patient is AO x3, in no acute distress. ?HEENT: Head is normocephalic and atraumatic. EOMI are intact. Mouth is well hydrated and without lesions. ?NECK: Supple. No masses ?LUNGS: Clear to auscultation. No presence  of rhonchi/wheezing/rales. Adequate chest expansion ?HEART: RRR, normal s1 and s2. ?ABDOMEN: Soft, nontender, no guarding, no peritoneal signs, and nondistended. BS +. No masses. ?EXTREMITIES: Without any cy

## 2022-01-14 DIAGNOSIS — K259 Gastric ulcer, unspecified as acute or chronic, without hemorrhage or perforation: Secondary | ICD-10-CM | POA: Insufficient documentation

## 2022-01-14 LAB — CELIAC DISEASE PANEL
(tTG) Ab, IgA: <1 U/mL
(tTG) Ab, IgG: 1.9 U/mL
Gliadin IgA: 3.5 U/mL
Gliadin IgG: 139.1 U/mL — ABNORMAL HIGH
Immunoglobulin A: 217 mg/dL (ref 70–320)

## 2022-06-13 IMAGING — MR MR ABDOMEN WO/W CM
20 of 21 series · 47 of 48 positions shown · IV contrast (gadavist)
Comparison: Previous MRI of November 11, 2019 and prior ultrasound
evaluations also from 5653 and most recent ultrasound from Monday December, 2020.

CLINICAL DATA: A 64-year-old female presents for follow-up of a
hypo echoic focus in the lateral margin of the LEFT kidney
suspicious for renal mass/perinephric hematoma.

EXAM:
MRI ABDOMEN WITHOUT AND WITH CONTRAST
TECHNIQUE: Multiplanar multisequence MR imaging of the abdomen was performed
both before and after the administration of intravenous contrast.
CONTRAST:  10mL GADAVIST GADOBUTROL 1 MMOL/ML IV SOLN

[Series 3: cor haste · coronal · 6.0mm · 1.25mm/px · 3 of 46 slices shown]
[im 1/46]
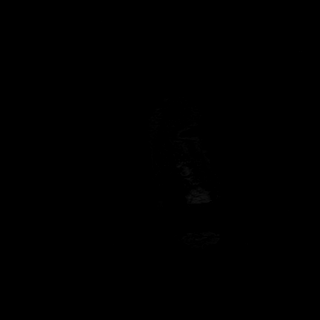
[im 23/46]
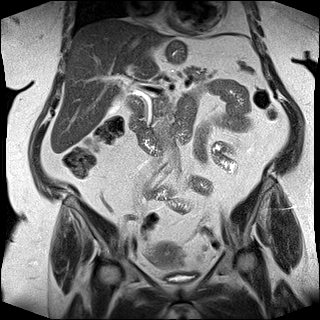
[im 46/46]
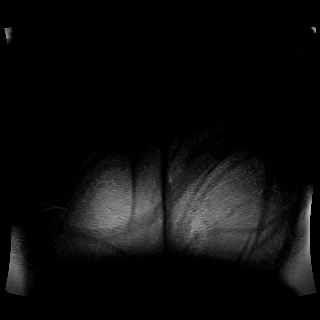

[Series 4: ax haste · axial · 6.0mm · 1.41mm/px · z∈[-223,-43]mm · 2 of 26 slices shown]
[im 1/26]
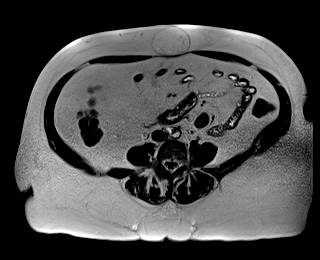
[im 26/26]
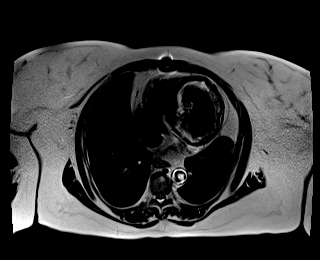

[Series 6: T2 fat-sat · axial · 6.0mm · 1.19mm/px · 1 of 30 slices shown]
[im 1/30]
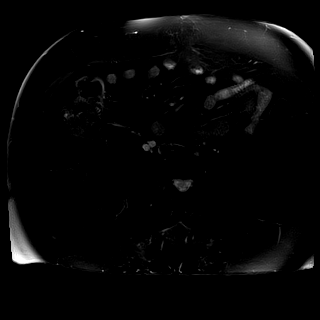

[Series 7: DWI · axial · 6.0mm · 1.68mm/px · 1 of 32 slices shown (1 of 4)]
[im 1/32]
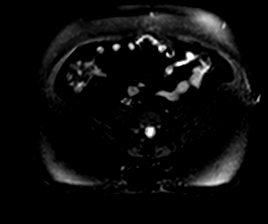

[Series 7: DWI · axial · 6.0mm · 1.68mm/px · 1 of 32 slices shown (2 of 4)]
[im 1/32]
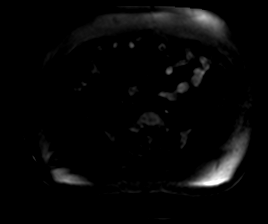

[Series 7: DWI · axial · 6.0mm · 1.68mm/px · 1 of 32 slices shown (3 of 4)]
[im 1/32]
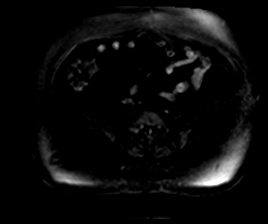

[Series 8: DWI · axial · 6.0mm · 1.68mm/px · 1 of 32 slices shown (4 of 4)]
[im 1/32]
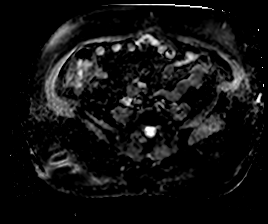

[Series 10: bSSFP · axial · 6.0mm · 0.88mm/px · 1 of 28 slices shown]
[im 1/28]
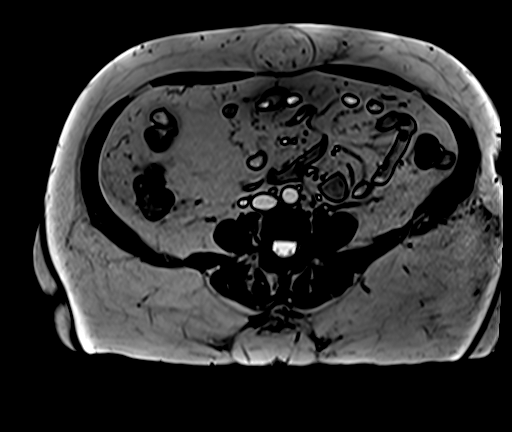

[Series 11: ax in and · axial · 3.0mm · 1.25mm/px · z∈[-251,-38]mm · 3 of 72 slices shown (1 of 2)]
[im 1/72]
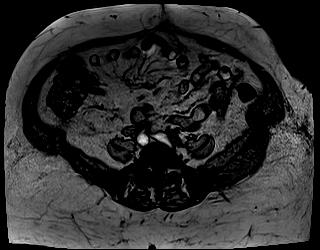
[im 36/72]
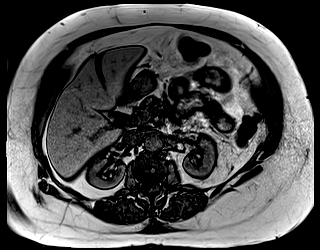
[im 72/72]
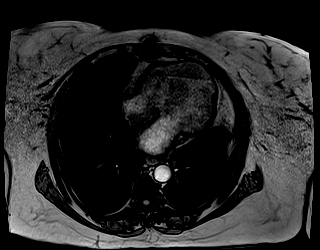

[Series 12: ax in and · axial · 3.0mm · 1.25mm/px · z∈[-251,-38]mm · 3 of 72 slices shown (2 of 2)]
[im 1/72]
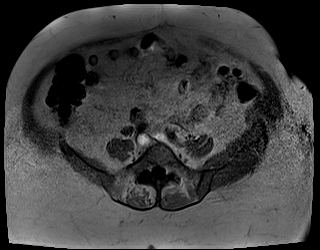
[im 36/72]
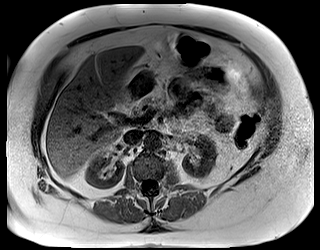
[im 72/72]
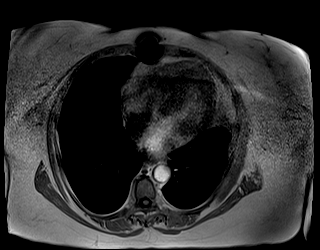

[Series 13: t1_vibe_fs_tra_p4_bh_pre · axial · 3.0mm · 1.34mm/px · z∈[-251,-38]mm · 3 of 72 slices shown]
[im 1/72]
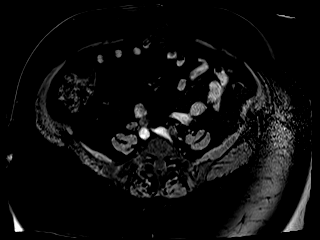
[im 36/72]
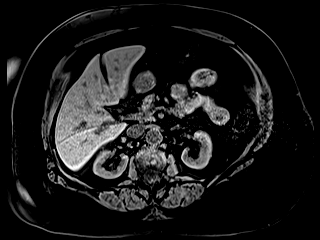
[im 72/72]
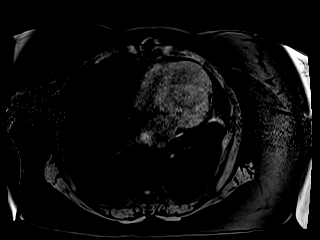

[Series 15: t1_vibe_fs_tra_p4_bh_post · axial · 3.0mm · 1.34mm/px · z∈[-251,-38]mm · 3 of 72 slices shown (1 of 4)]
[im 1/72]
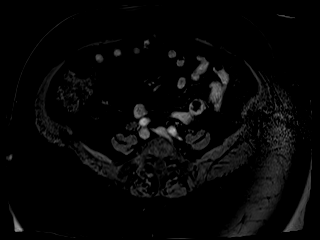
[im 36/72]
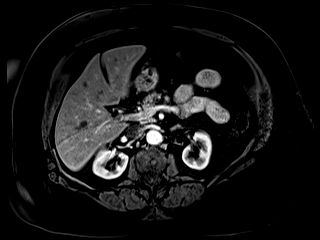
[im 72/72]
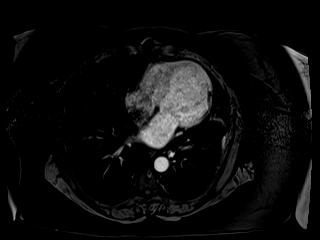

[Series 16: t1_vibe_fs_tra_p4_bh_post_sub · axial · 3.0mm · 1.34mm/px · z∈[-251,-38]mm · 3 of 72 slices shown (1 of 4)]
[im 1/72]
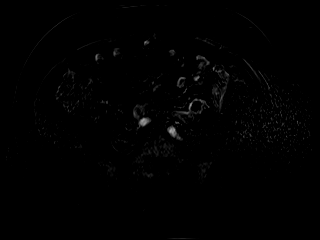
[im 36/72]
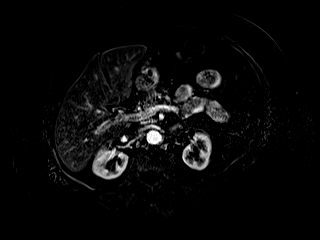
[im 72/72]
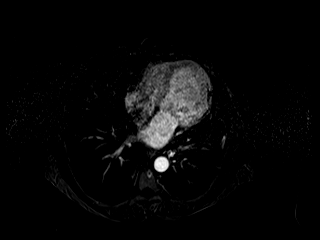

[Series 17: t1_vibe_fs_tra_p4_bh_post · axial · 3.0mm · 1.34mm/px · z∈[-251,-38]mm · 3 of 72 slices shown (2 of 4)]
[im 1/72]
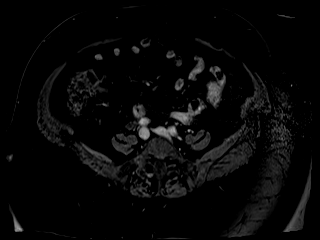
[im 36/72]
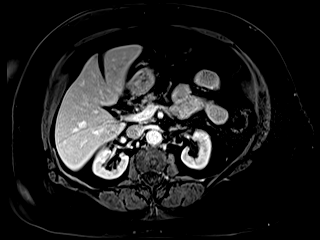
[im 72/72]
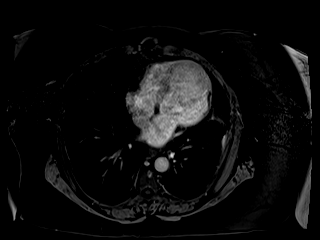

[Series 18: t1_vibe_fs_tra_p4_bh_post_sub · axial · 3.0mm · 1.34mm/px · z∈[-251,-38]mm · 3 of 72 slices shown (2 of 4)]
[im 1/72]
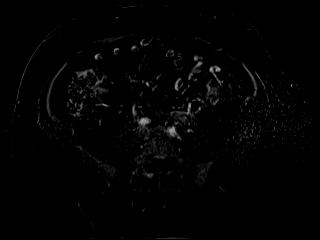
[im 36/72]
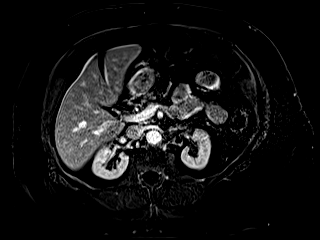
[im 72/72]
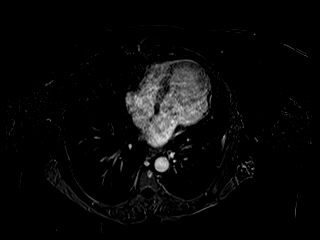

[Series 19: t1_vibe_fs_tra_p4_bh_post · axial · 3.0mm · 1.34mm/px · z∈[-251,-38]mm · 3 of 72 slices shown (3 of 4)]
[im 1/72]
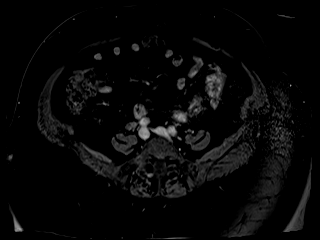
[im 36/72]
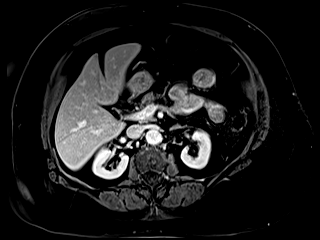
[im 72/72]
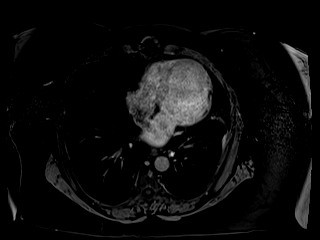

[Series 20: t1_vibe_fs_tra_p4_bh_post_sub · axial · 3.0mm · 1.34mm/px · z∈[-251,-38]mm · 3 of 72 slices shown (3 of 4)]
[im 1/72]
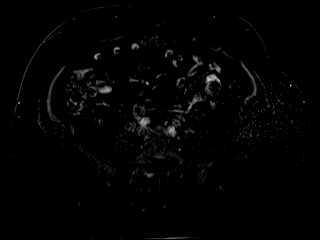
[im 36/72]
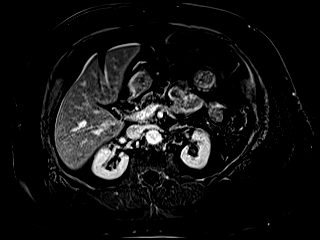
[im 72/72]
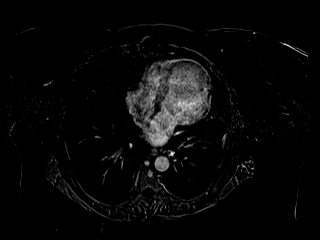

[Series 21: t1_vibe_fs_tra_p4_bh_post · axial · 3.0mm · 1.34mm/px · z∈[-251,-38]mm · 3 of 72 slices shown (4 of 4)]
[im 1/72]
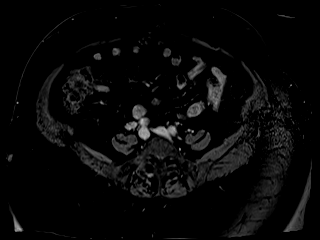
[im 36/72]
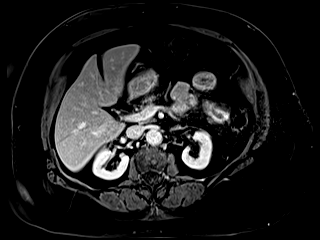
[im 72/72]
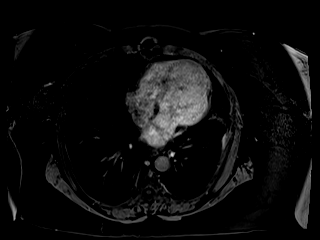

[Series 22: t1_vibe_fs_tra_p4_bh_post_sub · axial · 3.0mm · 1.34mm/px · z∈[-251,-38]mm · 3 of 72 slices shown (4 of 4)]
[im 1/72]
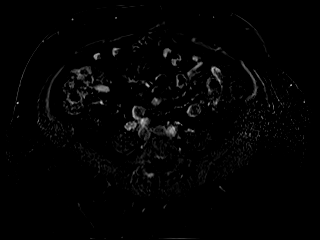
[im 36/72]
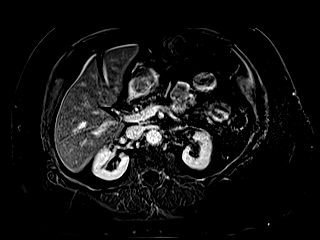
[im 72/72]
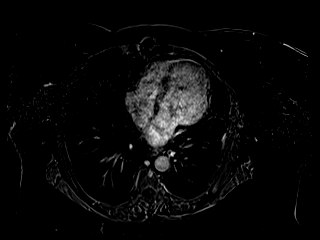

[Series 23: T1 dynamic post-contrast · coronal · 3.0mm · 1.31mm/px · 3 of 72 slices shown]
[im 1/72]
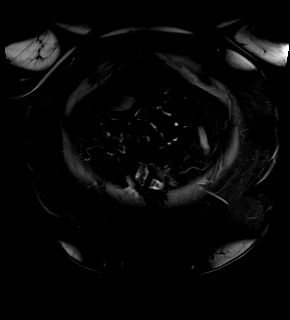
[im 36/72]
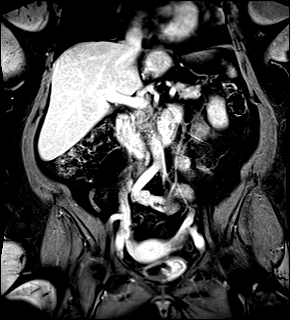
[im 72/72]
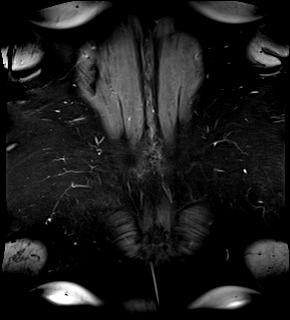

[47 of 48 positions shown; findings below may reference images not displayed]

FINDINGS: Lower chest: Incidental imaging of the lung bases is unremarkable on
MRI.

Hepatobiliary: No focal, suspicious hepatic lesion. The portal vein
is patent. Cholelithiasis with 2 cm gallstone in the gallbladder. No
pericholecystic stranding. No biliary duct dilation.

Pancreas: Intrinsic T1 signal mainly preserved in the pancreas. Mild
pancreatic atrophy with fatty asymmetry in the pancreatic head.
Signs of suspected divisum without substantial ductal dilation and
no signs of current inflammation.

Spleen:  Normal spleen.  The

Adrenals/Urinary Tract: Small T1 hyperintense focus remains at the
site of the previous perinephric hematoma which is almost completely
resolved.

This area is nonenhancing and measures approximately 12 mm greatest
axial dimension. There is a thin rim of T2 hypointense signal along
the margin of the LEFT kidney at the site of the previous hematoma
elsewhere. No capsular contour abnormality to suggest renal
parenchymal compression.

Small cysts in the RIGHT kidney less than a cm.

Adrenal glands are normal.

Stomach/Bowel: Moderate size hiatal hernia. No acute
gastrointestinal findings to the extent evaluated on abdominal MRI.

Vascular/Lymphatic: No pathologically enlarged lymph nodes
identified. No abdominal aortic aneurysm demonstrated.

Other: Moderate size supraumbilical ventral midline hernia contains
fat. This is unchanged. There is a small enhancing area along the
posterior LEFT flank/back (image 34/13 and image 34/17. This
measures approximately 17 mm x 2-3 mm and appears to be upon the
skin surface or just deep to the skin surface. Area appears to
enhance based on subtraction images but is intrinsically bright on
T1 as well. Also with restricted diffusion.

Musculoskeletal: Suspected skin lesion as above. No acute bone
finding or suspicious bone lesion on this abdominal MRI.
IMPRESSION: Near complete resolution of the previous hematoma in the LEFT
kidney. No underlying suspicious renal lesion.

No evidence of renal parenchymal compression or other acute findings
in the abdomen.

Small focus along the skin surface or deep to the skin surface of
the LEFT posterolateral back as outlined above. Findings raise the
question of focal lesion such as cutaneous neoplasm. Consider
correlation with direct clinical inspection to exclude neoplasm.
Differential consideration would also include complicated sebaceous
cyst though imaging features not compatible currently with classic
sebaceous cyst. There does appear to have been a finding in this
location on prior studies though perhaps slightly larger and more
conspicuous, better seen on today's study given that there is
improved visualization due to increased field of view.

Moderate size hiatal hernia.

Cholelithiasis.

Suspected pancreatic divisum.

## 2022-11-19 ENCOUNTER — Encounter (INDEPENDENT_AMBULATORY_CARE_PROVIDER_SITE_OTHER): Payer: Self-pay | Admitting: Gastroenterology

## 2023-01-15 ENCOUNTER — Ambulatory Visit (INDEPENDENT_AMBULATORY_CARE_PROVIDER_SITE_OTHER): Payer: 59 | Admitting: Gastroenterology

## 2023-09-11 ENCOUNTER — Encounter (INDEPENDENT_AMBULATORY_CARE_PROVIDER_SITE_OTHER): Payer: Self-pay | Admitting: *Deleted

## 2024-05-19 ENCOUNTER — Ambulatory Visit (INDEPENDENT_AMBULATORY_CARE_PROVIDER_SITE_OTHER): Admitting: Orthopaedic Surgery

## 2024-05-19 ENCOUNTER — Encounter: Payer: Self-pay | Admitting: Orthopaedic Surgery

## 2024-05-19 DIAGNOSIS — M25561 Pain in right knee: Secondary | ICD-10-CM | POA: Diagnosis not present

## 2024-05-19 DIAGNOSIS — Z7901 Long term (current) use of anticoagulants: Secondary | ICD-10-CM | POA: Diagnosis not present

## 2024-05-19 DIAGNOSIS — G8929 Other chronic pain: Secondary | ICD-10-CM | POA: Diagnosis not present

## 2024-05-19 NOTE — Progress Notes (Signed)
 Subjective:    Patient ID: Laura Lane, female    DOB: Jan 08, 1956, 68 y.o.   MRN: [id number]  HPI She has giving way and pain of the right knee for several months getting worse.  She has popping and swelling as well.  She was seen by her family doctor.  She had an injection that helped but still had giving way.  A MRI was done on 05-10-24 showing: 1.    Free margin tearing of the posterior horn and body of the medial meniscus with partial extrusion of the body into the medial gutter.   2.    Tricompartmental osteoarthrosis, most pronounced in the medial and patellofemoral compartments.   3.    Bone infarcts in the medial and lateral femoral condyles and tibial plateaus.   4.    Small joint effusion and small Baker's cyst.   She is on chronic anticoagulation for heart failure problems.    She would like to consider surgery but wants to wait until the first of the new year as her insurance will change then.  I will arrange for her to be seen at the [facility] office.  I am retiring in three weeks.  She is agreeable to this.  She will need to have the anticoagulation addressed prior to surgery.   Review of Systems  Constitutional:  Positive for activity change.  Respiratory:  Positive for shortness of breath.   Cardiovascular:  Positive for chest pain and leg swelling.  Musculoskeletal:  Positive for arthralgias, gait problem and joint swelling.  For Review of Systems, all other systems reviewed and are negative.  The following is a summary of the past history medically, past history surgically, known current medicines, social history and family history.  This information is gathered electronically by the computer from prior information and documentation.  I review this each visit and have found including this information at this point in the chart is beneficial and informative.   Past Medical History:  Diagnosis Date   Cataracts, bilateral    CHF (congestive heart failure) ([facility])     COPD (chronic obstructive pulmonary disease) ([facility])    Hx of CABG    Hypothyroid    MI (myocardial infarction) ([facility])    Renal disorder    Thyroid  disease     Past Surgical History:  Procedure Laterality Date   BIOPSY  10/16/2021   Procedure: BIOPSY;  Surgeon: Eartha Angelia Sieving, MD;  [facility];  Service: Gastroenterology;;   CARDIAC SURGERY     CATARACT EXTRACTION Bilateral    COLONOSCOPY WITH PROPOFOL  N/A 10/16/2021   Procedure: COLONOSCOPY WITH PROPOFOL ;  Surgeon: Eartha Angelia Sieving, MD;  [facility];  Service: Gastroenterology;  Laterality: N/A;  1245   CORONARY ARTERY BYPASS GRAFT     ESOPHAGOGASTRODUODENOSCOPY (EGD) WITH PROPOFOL  N/A 10/16/2021   Procedure: ESOPHAGOGASTRODUODENOSCOPY (EGD) WITH PROPOFOL ;  Surgeon: Eartha Angelia Sieving, MD;  [facility];  Service: Gastroenterology;  Laterality: N/A;   GASTROSTOMY W/ FEEDING TUBE     INSERTION OF DIALYSIS CATHETER     POLYPECTOMY  10/16/2021   Procedure: POLYPECTOMY;  Surgeon: Eartha Angelia, Sieving, MD;  [facility];  Service: Gastroenterology;;   removal of cyst on labia        TRACHEOSTOMY      Current Outpatient Medications on File Prior to Visit  Medication Sig Dispense Refill   acetaminophen  (TYLENOL ) 650 MG CR tablet Take 650-1,300 mg by mouth every 8 (eight) hours  as needed for pain.     Albuterol Sulfate (PROAIR RESPICLICK) 108 (90 Base) MCG/ACT AEPB Inhale 2 puffs into the lungs every 6 (six) hours as needed (shortness of breath).     ALPRAZolam  (XANAX ) 1 MG tablet Take 1 mg by mouth at bedtime.     aspirin  EC 81 MG tablet Take 81 mg by mouth daily.     Calcium  Citrate-Vitamin D 250-2.5 MG-MCG TABS Take 1 mg by mouth.     carvedilol (COREG) 12.5 MG tablet Take 12.5 mg by mouth.     cetirizine (ZYRTEC) 10 MG tablet Take 10 mg by mouth daily.     cholecalciferol (VITAMIN D3) 25 MCG (1000 UNIT) tablet Take 1,000 Units by mouth.     clobetasol cream  (TEMOVATE) 0.05 % Apply 1 application topically daily as needed (psoriasis).     Cyanocobalamin (B-12 PO) Take by mouth. Thorne B!@ 1mg  once daily     cyanocobalamin (VITAMIN B12) 1000 MCG tablet Take 1,000 mcg by mouth.     cyanocobalamin 50 MCG tablet Take 1 tablet by mouth daily.     Fluticasone Propionate, Inhal, 250 MCG/ACT AEPB Inhale into the lungs in the morning and at bedtime.     Lactobacillus Rhamnosus, GG, (CULTURELLE) CAPS Take 1 capsule by mouth daily.     Lactobacillus-Inulin (CULTURELLE DIGESTIVE DAILY PO) Take 1 capsule by mouth daily at 6 (six) AM.     levothyroxine  (SYNTHROID ) 150 MCG tablet Take 150 mcg by mouth daily before breakfast.     methocarbamol (ROBAXIN) 500 MG tablet Take 500 mg by mouth daily as needed for muscle spasms.     olmesartan (BENICAR) 20 MG tablet Take 10 mg by mouth daily.     OVER THE COUNTER MEDICATION Calcium  citrate plus D3 630mg /500Iu     rosuvastatin (CRESTOR) 20 MG tablet Take 20 mg by mouth daily.     spironolactone (ALDACTONE) 25 MG tablet Take 25 mg by mouth daily.     torsemide (DEMADEX) 20 MG tablet Take 20 mg by mouth daily as needed (swelling).     triamcinolone cream (KENALOG) 0.1 % Apply 1 application topically daily as needed (itching).     warfarin (COUMADIN ) 5 MG tablet Take 5 mg by mouth daily.     acetaminophen  (TYLENOL ) 650 MG CR tablet Take 650 mg by mouth.     amLODipine (NORVASC) 5 MG tablet Take 5 mg by mouth at bedtime.     famotidine  (PEPCID ) 40 MG tablet Take 1 tablet (40 mg total) by mouth daily. 90 tablet 3   No current facility-administered medications on file prior to visit.    Social History   Socioeconomic History   Marital status: Married    Spouse name: Not on file   Number of children: Not on file   Years of education: Not on file   Highest education level: Not on file  Occupational History   Not on file  Tobacco Use   Smoking status: Former    Current packs/day: 0.00    Types: Cigarettes    Start  date: 08/17/1972    Quit date: 02/06/2014    Years since quitting: 10.2    Passive exposure: Past   Smokeless tobacco: Never  Vaping Use   Vaping status: Never Used  Substance and Sexual Activity   Alcohol use: No    Alcohol/week: 0.0 standard drinks of alcohol   Drug use: No   Sexual activity: Not on file  Other Topics Concern   Not on  file  Social History Narrative   Not on file   Social Drivers of Health   Financial Resource Strain: Low Risk  (10/24/2020)   [facility] from [facility]   Overall Financial Resource Strain (CARDIA)    Difficulty of Paying Living Expenses: Not very hard  Food Insecurity: Low Risk  (12/10/2022)   Received from [facility]   Hunger Vital Sign    Within the past 12 months, you worried that your food would run out before you got money to buy more: Never true    Within the past 12 months, the food you bought just didn't last and you didn't have money to get more. : Never true  Transportation Needs: No Transportation Needs (12/10/2022)   Received from [facility]    In the past 12 months, has lack of reliable transportation kept you from medical appointments, meetings, work or from getting things needed for daily living? : No  Physical Activity: Not on file  Stress: No Stress Concern Present (10/24/2020)   Received from [facility] - Occupational Stress Questionnaire    Feeling of Stress : Not at all  Social Connections: Unknown (01/20/2022)   Received from [facility]   Social Network    Social Network: Not on file  Intimate Partner Violence: Unknown (12/12/2021)   Received from [facility]   HITS    Physically Hurt: Not on file    Insult or Talk Down To: Not on file    Threaten Physical Harm: Not on file    Scream or Curse: Not on file    Family History  Problem Relation Age of Onset   Lung disease Father    Breast cancer Mother    Kidney disease Mother    CAD Neg Hx      There were no vitals taken for this visit.  There is no height or weight on file to calculate BMI.      Objective:   Physical Exam Vitals reviewed.  Constitutional:      Appearance: She is well-developed.  HENT:     Head: Normocephalic and atraumatic.  Eyes:     Conjunctiva/sclera: Conjunctivae normal.     Pupils: Pupils are equal, round, and reactive to light.  Cardiovascular:     Rate and Rhythm: Normal rate and regular rhythm.  Pulmonary:     Effort: Pulmonary effort is normal.  Abdominal:     Palpations: Abdomen is soft.  Musculoskeletal:     Cervical back: Normal range of motion and neck supple.       Legs:  Skin:    General: Skin is warm and dry.  Neurological:     Mental Status: She is alert and oriented to person, place, and time.     Cranial Nerves: No cranial nerve deficit.     Motor: No abnormal muscle tone.     Coordination: Coordination normal.     Deep Tendon Reflexes: Reflexes are normal and symmetric. Reflexes normal.  Psychiatric:        Behavior: Behavior normal.        Thought Content: Thought content normal.        Judgment: Judgment normal.   I have independently reviewed the MRI.            Assessment & Plan:   Encounter Diagnoses  Name Primary?   Chronic pain of right knee Yes   Chronic anticoagulation    To [place] office  for further care and consideration of arthroscopy.  I have informed the patient I will be retiring from medical practice and from this office on June 09, 2024.  The patient has been offered continuing care with Dr. Margrette or Dr. Onesimo of this office.  The patient may choose another provider and the records will be forwarded after proper signature and notification.  Patient understands and agrees.  Call if any problem.  Precautions discussed.  Electronically Signed Lemond Stable, MD 9/11/202510:20 AM

## 2024-05-19 NOTE — Addendum Note (Signed)
 Addended by: TRINDA DEANE HERO on: 05/19/2024 10:50 AM   Modules accepted: Orders

## 2024-06-08 ENCOUNTER — Ambulatory Visit: Admitting: Orthopedic Surgery

## 2024-06-08 DIAGNOSIS — G8929 Other chronic pain: Secondary | ICD-10-CM | POA: Diagnosis not present

## 2024-06-08 DIAGNOSIS — M25561 Pain in right knee: Secondary | ICD-10-CM | POA: Diagnosis not present

## 2024-06-09 ENCOUNTER — Encounter: Payer: Self-pay | Admitting: Orthopedic Surgery

## 2024-06-09 NOTE — Progress Notes (Unsigned)
 Office Visit Note   Patient: Laura Lane           Date of Birth: 11-10-1955           MRN: [id number] Visit Date: 06/08/2024 Requested by: Brenna Lin, MD [address] [place],  [place] [address] PCP: Orpha Yancey LABOR, MD  Subjective: Chief Complaint  Patient presents with   Right Knee - Pain    HPI: Laura Lane is a 68 y.o. female who presents to the office reporting right knee pain.  Has been going on for several months.  Denies any history of injury.  She did have an injection with Dr. Tillman several weeks ago which helped.  She has had an MRI scan which shows tricompartmental arthritis most pronounced in the medial and patellofemoral compartment along with bone infarcts and degenerative type tearing of that medial meniscus without unstable fragment..                ROS: All systems reviewed are negative as they relate to the chief complaint within the history of present illness.  Patient denies fevers or chills.  Assessment & Plan: Visit Diagnoses:  1. Chronic pain of right knee     Plan: Impression is right knee pain with no clear-cut indication of predictable relief from arthroscopic intervention based primarily on the amount of arthritis that is present in the knee along with her increased body mass index.  She is having primarily pain symptoms and fewer mechanical symptoms.  I do think a cadence of alternating cortisone injection and gel injection every 3 months would be a good strategy for Macario to pursue until the pain becomes severe enough that she wants to do knee replacement.  She will likely do that with Dr. Tillman in [place] she will follow-up with us  as needed..  Follow-Up Instructions: No follow-ups on file.   Orders:  No orders of the defined types were placed in this encounter.  No orders of the defined types were placed in this encounter.     Procedures: No procedures performed   Clinical Data: No additional findings.  Objective: Vital  Signs: There were no vitals taken for this visit.  Physical Exam:  Constitutional: Patient appears well-developed HEENT:  Head: Normocephalic Eyes:EOM are normal Neck: Normal range of motion Cardiovascular: Normal rate Pulmonary/chest: Effort normal Neurologic: Patient is alert Skin: Skin is warm Psychiatric: Patient has normal mood and affect  Ortho Exam: Ortho exam demonstrates no effusion.  Skin is intact in the right knee region.  Range of motion is near full extension to 110 of flexion.  Collateral and cruciate ligaments are stable.  No groin pain with internal/external rotation of the leg.  Mild patellofemoral crepitus is present.  No focal joint line tenderness is present on the medial or lateral side.  Specialty Comments:  No specialty comments available.  Imaging: No results found.   PMFS History: Patient Active Problem List   Diagnosis Date Noted   Ole ulcer 01/14/2022   Celiac disease 01/13/2022   Acute esophagitis 01/13/2022   Hiatal hernia 01/13/2022   Recent change in frequency of bowel movements 09/16/2021   Urinary urgency 07/04/2020   Renal mass 09/21/2019   Acute systolic congestive heart failure (HCC) 06/23/2014   History of MI (myocardial infarction) 06/23/2014   Anemia 06/23/2014   Hypothyroidism 06/23/2014   CKD (chronic kidney disease), stage III (HCC) 06/23/2014   Acute respiratory failure with hypoxia (HCC) 06/23/2014   Tracheal stenosis 06/23/2014  Multiple Decubitus skin ulcers 06/23/2014   S/P CABG (coronary artery bypass graft) 06/23/2014   Morbid obesity with BMI of 45.0-49.9, adult (HCC) 06/23/2014   History of RUE DVT  06/23/2014   Chronic anticoagulation 06/23/2014   Mitral regurgitation (moderate) 06/23/2014   Past Medical History:  Diagnosis Date   Cataracts, bilateral    CHF (congestive heart failure) (HCC)    COPD (chronic obstructive pulmonary disease) (HCC)    Hx of CABG    Hypothyroid    MI (myocardial infarction)  (HCC)    Renal disorder    Thyroid  disease     Family History  Problem Relation Age of Onset   Lung disease Father    Breast cancer Mother    Kidney disease Mother    CAD Neg Hx     Past Surgical History:  Procedure Laterality Date   BIOPSY  10/16/2021   Procedure: BIOPSY;  Surgeon: Eartha Angelia Sieving, MD;  [facility];  Service: Gastroenterology;;   CARDIAC SURGERY     CATARACT EXTRACTION Bilateral    COLONOSCOPY WITH PROPOFOL  N/A 10/16/2021   Procedure: COLONOSCOPY WITH PROPOFOL ;  Surgeon: Eartha Angelia Sieving, MD;  Location: [facility];  Service: Gastroenterology;  Laterality: N/A;  1245   CORONARY ARTERY BYPASS GRAFT     ESOPHAGOGASTRODUODENOSCOPY (EGD) WITH PROPOFOL  N/A 10/16/2021   Procedure: ESOPHAGOGASTRODUODENOSCOPY (EGD) WITH PROPOFOL ;  Surgeon: Eartha Angelia Sieving, MD;  Location: [facility];  Service: Gastroenterology;  Laterality: N/A;   GASTROSTOMY W/ FEEDING TUBE     INSERTION OF DIALYSIS CATHETER     POLYPECTOMY  10/16/2021   Procedure: POLYPECTOMY;  Surgeon: Eartha Angelia Sieving, MD;  Location: [facility];  Service: Gastroenterology;;   removal of cyst on labia        TRACHEOSTOMY     Social History   Occupational History   Not on file  Tobacco Use   Smoking status: Former    Current packs/day: 0.00    Types: Cigarettes    Start date: 08/17/1972    Quit date: 02/06/2014    Years since quitting: 10.3    Passive exposure: Past   Smokeless tobacco: Never  Vaping Use   Vaping status: Never Used  Substance and Sexual Activity   Alcohol use: No    Alcohol/week: 0.0 standard drinks of alcohol   Drug use: No   Sexual activity: Not on file

## 2024-06-22 ENCOUNTER — Encounter (INDEPENDENT_AMBULATORY_CARE_PROVIDER_SITE_OTHER): Payer: Self-pay | Admitting: Gastroenterology

## 2024-07-01 ENCOUNTER — Encounter: Payer: Self-pay | Admitting: Internal Medicine

## 2024-07-11 ENCOUNTER — Encounter: Payer: Self-pay | Admitting: Radiology

## 2024-08-17 ENCOUNTER — Telehealth (HOSPITAL_COMMUNITY): Payer: Self-pay

## 2024-08-17 ENCOUNTER — Other Ambulatory Visit (HOSPITAL_COMMUNITY): Payer: Self-pay

## 2024-08-17 ENCOUNTER — Ambulatory Visit (HOSPITAL_COMMUNITY): Admission: RE | Admit: 2024-08-17 | Discharge: 2024-08-17 | Attending: Internal Medicine | Admitting: Internal Medicine

## 2024-08-17 ENCOUNTER — Ambulatory Visit (HOSPITAL_COMMUNITY): Payer: Self-pay | Admitting: *Deleted

## 2024-08-17 VITALS — BP 84/50 | HR 94 | Ht <= 58 in | Wt 235.6 lb

## 2024-08-17 DIAGNOSIS — R0602 Shortness of breath: Secondary | ICD-10-CM

## 2024-08-17 DIAGNOSIS — I252 Old myocardial infarction: Secondary | ICD-10-CM | POA: Insufficient documentation

## 2024-08-17 DIAGNOSIS — N1832 Chronic kidney disease, stage 3b: Secondary | ICD-10-CM | POA: Insufficient documentation

## 2024-08-17 DIAGNOSIS — I480 Paroxysmal atrial fibrillation: Secondary | ICD-10-CM

## 2024-08-17 DIAGNOSIS — J449 Chronic obstructive pulmonary disease, unspecified: Secondary | ICD-10-CM | POA: Insufficient documentation

## 2024-08-17 DIAGNOSIS — I2510 Atherosclerotic heart disease of native coronary artery without angina pectoris: Secondary | ICD-10-CM | POA: Diagnosis not present

## 2024-08-17 DIAGNOSIS — Z87891 Personal history of nicotine dependence: Secondary | ICD-10-CM | POA: Insufficient documentation

## 2024-08-17 DIAGNOSIS — Z951 Presence of aortocoronary bypass graft: Secondary | ICD-10-CM | POA: Insufficient documentation

## 2024-08-17 DIAGNOSIS — Z79899 Other long term (current) drug therapy: Secondary | ICD-10-CM | POA: Insufficient documentation

## 2024-08-17 DIAGNOSIS — I5022 Chronic systolic (congestive) heart failure: Secondary | ICD-10-CM | POA: Diagnosis not present

## 2024-08-17 DIAGNOSIS — Z7901 Long term (current) use of anticoagulants: Secondary | ICD-10-CM | POA: Insufficient documentation

## 2024-08-17 DIAGNOSIS — I4819 Other persistent atrial fibrillation: Secondary | ICD-10-CM | POA: Insufficient documentation

## 2024-08-17 DIAGNOSIS — Z6841 Body Mass Index (BMI) 40.0 and over, adult: Secondary | ICD-10-CM | POA: Insufficient documentation

## 2024-08-17 LAB — CBC
HCT: 41.3 % (ref 36.0–46.0)
Hemoglobin: 13.1 g/dL (ref 12.0–15.0)
MCH: 32 pg (ref 26.0–34.0)
MCHC: 31.7 g/dL (ref 30.0–36.0)
MCV: 100.7 fL — ABNORMAL HIGH (ref 80.0–100.0)
Platelets: 214 K/uL (ref 150–400)
RBC: 4.1 MIL/uL (ref 3.87–5.11)
RDW: 14.2 % (ref 11.5–15.5)
WBC: 7.5 K/uL (ref 4.0–10.5)
nRBC: 0 % (ref 0.0–0.2)

## 2024-08-17 LAB — COMPREHENSIVE METABOLIC PANEL WITH GFR
ALT: 20 U/L (ref 0–44)
AST: 21 U/L (ref 15–41)
Albumin: 3.3 g/dL — ABNORMAL LOW (ref 3.5–5.0)
Alkaline Phosphatase: 54 U/L (ref 38–126)
Anion gap: 8 (ref 5–15)
BUN: 41 mg/dL — ABNORMAL HIGH (ref 8–23)
CO2: 32 mmol/L (ref 22–32)
Calcium: 9.2 mg/dL (ref 8.9–10.3)
Chloride: 101 mmol/L (ref 98–111)
Creatinine, Ser: 1.73 mg/dL — ABNORMAL HIGH (ref 0.44–1.00)
GFR, Estimated: 32 mL/min — ABNORMAL LOW (ref >60)
Glucose, Bld: 93 mg/dL (ref 70–99)
Potassium: 4.9 mmol/L (ref 3.5–5.1)
Sodium: 141 mmol/L (ref 135–145)
Total Bilirubin: 0.8 mg/dL (ref 0.0–1.2)
Total Protein: 6.5 g/dL (ref 6.5–8.1)

## 2024-08-17 LAB — BRAIN NATRIURETIC PEPTIDE: B Natriuretic Peptide: 676.9 pg/mL — ABNORMAL HIGH (ref 0.0–100.0)

## 2024-08-17 LAB — PROTIME-INR
INR: 4.5 (ref 0.8–1.2)
Prothrombin Time: 44.4 s — ABNORMAL HIGH (ref 11.4–15.2)

## 2024-08-17 MED ORDER — DIGOXIN 125 MCG PO TABS: 0.1250 mg | ORAL_TABLET | Freq: Every day | ORAL | 3 refills | Status: DC

## 2024-08-17 MED ORDER — DIGOXIN 125 MCG PO TABS: 0.0625 mg | ORAL_TABLET | Freq: Every day | ORAL | 3 refills | Status: DC

## 2024-08-17 MED ORDER — AMIODARONE HCL 200 MG PO TABS: 200.0000 mg | ORAL_TABLET | Freq: Every day | ORAL | 3 refills | Status: AC

## 2024-08-17 MED ORDER — TORSEMIDE 20 MG PO TABS: 40.0000 mg | ORAL_TABLET | Freq: Every day | ORAL | 6 refills | Status: DC

## 2024-08-17 MED ORDER — POTASSIUM CHLORIDE CRYS ER 20 MEQ PO TBCR: 40.0000 meq | EXTENDED_RELEASE_TABLET | Freq: Every day | ORAL | 0 refills | Status: DC | PRN

## 2024-08-17 NOTE — Progress Notes (Signed)
 [facility] NEW PATIENT NOTE  Referring Physician: Orpha Yancey LABOR, MD Primary Care: Orpha Yancey LABOR, MD Primary Cardiologist: None  Chief Complaint:    HPI:  Laura Lane is a 68 y/o woman with morbid obesity. COPD (quit 2015), PAF, CKD 3b, CAD s/p CABG and systolic HF presents for a second opinion regarding her HF  Admitted to [facility] with NSTEMI. Found to have CAD. Was undergoing PCI and had coronary rupture and went to emergent CABG. Had prolonged hospitalization with CVVHD and tracheostomy support. Subsequently lost to Cardiology f/u.   Admitted to [facility] in 4/24 for HF in the setting of new AV. Echo in 4/24 showed EF 15-20%.Underwent DC-CV and medical management.   Has not had routine cardiology f/u since.   PCP did some screening in 10/25  and told her that her numbers weren't OK and got echo and ECG. ECG apparently showed AF. Echo at [facility] on 06/23/24 EF 20-25% G1DD RV severely reduced no significant valvular disease  Here with her husband for initial cardiology evaluation. Over past 6 months to a year has been very limited. Can barely get around house to do ADLs. + LE edema. + occasional chest tightness. Taking torsemide 40 daily. BP remains low and often gets dizzy when standing.    Past Medical History:  Diagnosis Date   Cataracts, bilateral    CHF (congestive heart failure) (HCC)    COPD (chronic obstructive pulmonary disease) (HCC)    Hx of CABG    Hypothyroid    MI (myocardial infarction) (HCC)    Renal disorder    Thyroid  disease     Current Outpatient Medications  Medication Sig Dispense Refill   acetaminophen  (TYLENOL ) 650 MG CR tablet Take 650-1,300 mg by mouth every 8 (eight) hours as needed for pain.     Albuterol Sulfate (PROAIR RESPICLICK) 108 (90 Base) MCG/ACT AEPB Inhale 2 puffs into the lungs every 6 (six) hours as needed (shortness of breath).     ALPRAZolam  (XANAX ) 1 MG tablet Take 1 mg by mouth at bedtime.     carvedilol (COREG) 12.5 MG  tablet Take 12.5 mg by mouth.     cetirizine (ZYRTEC) 10 MG tablet Take 10 mg by mouth daily.     cholecalciferol (VITAMIN D3) 25 MCG (1000 UNIT) tablet Take 1,000 Units by mouth.     clobetasol cream (TEMOVATE) 0.05 % Apply 1 application topically daily as needed (psoriasis).     eplerenone (INSPRA) 25 MG tablet Take 25 mg by mouth daily.     Fluticasone Propionate, Inhal, 250 MCG/ACT AEPB Inhale into the lungs in the morning and at bedtime.     Lactobacillus-Inulin (CULTURELLE DIGESTIVE DAILY PO) Take 1 capsule by mouth daily at 6 (six) AM.     levothyroxine  (SYNTHROID ) 100 MCG tablet Take 100 mcg by mouth daily before breakfast.     methocarbamol (ROBAXIN) 500 MG tablet Take 500 mg by mouth daily as needed for muscle spasms.     olmesartan (BENICAR) 20 MG tablet Take 10 mg by mouth daily.     rosuvastatin (CRESTOR) 20 MG tablet Take 20 mg by mouth daily.     torsemide (DEMADEX) 20 MG tablet Take 20 mg by mouth daily as needed (swelling).     triamcinolone cream (KENALOG) 0.1 % Apply 1 application topically daily as needed (itching).     warfarin (COUMADIN ) 5 MG tablet Take 5 mg by mouth daily.     Calcium  Citrate-Vitamin D 250-2.5 MG-MCG TABS Take 1  mg by mouth. (Patient not taking: Reported on 08/17/2024)     Cyanocobalamin (B-12 PO) Take by mouth. Laura B!@ 1mg  once daily (Patient not taking: Reported on 08/17/2024)     cyanocobalamin (VITAMIN B12) 1000 MCG tablet Take 1,000 mcg by mouth. (Patient not taking: Reported on 08/17/2024)     levothyroxine  (SYNTHROID ) 150 MCG tablet Take 150 mcg by mouth daily before breakfast. (Patient not taking: Reported on 08/17/2024)     OVER THE COUNTER MEDICATION Calcium  citrate plus D3 630mg /500Iu (Patient not taking: Reported on 08/17/2024)     No current facility-administered medications for this encounter.    Allergies  Allergen Reactions   Boniva [Ibandronate]    Losartan Other (See Comments)    Unknown reaction.    Zofran  [Ondansetron ] Nausea  And Vomiting and Other (See Comments)    headaches      Social History   Socioeconomic History   Marital status: Married    Spouse name: Not on file   Number of children: Not on file   Years of education: Not on file   Highest education level: Not on file  Occupational History   Not on file  Tobacco Use   Smoking status: Former    Current packs/day: 0.00    Types: Cigarettes    Start date: 08/17/1972    Quit date: 02/06/2014    Years since quitting: 10.5    Passive exposure: Past   Smokeless tobacco: Never  Vaping Use   Vaping status: Never Used  Substance and Sexual Activity   Alcohol use: No    Alcohol/week: 0.0 standard drinks of alcohol   Drug use: No   Sexual activity: Not on file  Other Topics Concern   Not on file  Social History Narrative   Not on file   Social Drivers of Health   Financial Resource Strain: Low Risk (10/24/2020)   Received from [facility]   Overall Financial Resource Strain (CARDIA)    Difficulty of Paying Living Expenses: Not very hard  Food Insecurity: Low Risk (12/10/2022)   Received from [facility]   Hunger Vital Sign    Within the past 12 months, you worried that your food would run out before you got money to buy more: Never true    Within the past 12 months, the food you bought just didn't last and you didn't have money to get more. : Never true  Transportation Needs: No Transportation Needs (12/10/2022)   Received from [facility]    In the past 12 months, has lack of reliable transportation kept you from medical appointments, meetings, work or from getting things needed for daily living? : No  Physical Activity: Not on file  Stress: No Stress Concern Present (10/24/2020)   Received from [facility] - Occupational Stress Questionnaire    Feeling of Stress : Not at all  Social Connections: Unknown (01/20/2022)   Received from [facility]   Social Network    Social  Network: Not on file  Intimate Partner Violence: Unknown (12/12/2021)   Received from [facility]   HITS    Physically Hurt: Not on file    Insult or Talk Down To: Not on file    Threaten Physical Harm: Not on file    Scream or Curse: Not on file      Family History  Problem Relation Age of Onset   Lung disease Father    Breast cancer Mother  Kidney disease Mother    CAD Neg Hx     Vitals:   08/17/24 1057 08/17/24 1100 08/17/24 1101  BP: (!) 90/56 (!) 90/56 (!) 84/50  Pulse: 71 71 94  SpO2: 94%    Weight: 106.9 kg (235 lb 9.6 oz)    Height: 4' 10 (1.473 m)      PHYSICAL EXAM: General:  Obese woman. Audible wheezing HEENT: normal Neck: supple. Hard to see JVP looks elevated Cor: Irregular irregular Lungs: + wheezing Abdomen: obese soft, nontender, nondistended. Good bowel sounds. Extremities: no cyanosis, clubbing, rash, 2+ edema Neuro: alert & oriented x 3, cranial nerves grossly intact. moves all 4 extremities w/o difficulty. Affect pleasant.  ECG: AF 95 + 1 PVC nonspecific ST. Narrow QRS Personally reviewed   ReDS 47%   ASSESSMENT & PLAN:  1. Chronic systolic HF with biventricular dysfunction - suspect mostly iCM. S/p CABG 2015. But may also have component of NICM - Echo 10/25 EF 20-25% G1 DD RV severely HK. Valves ok. [facility]) - NYHA IIIB-IV  - Volume overloaded. BP low - Stop carvedilol - Add digoxin 0.0625mg  daily - Continue eplerenone 25 - Continue olmesartan 20 - Continue torsemide 40 daily - Eventual SGLT2i - Furoscix  80mg  + 40KCl x 3 days - Compression hose - RTC next week - Eventually will need R/L cath - Management of AF as below - Currently not candidate for advanced therapies  2. Persistent AF - duration unknown - currently rate controlled - given severity of HF will try to get back in NSR - start amio 200 daily - continue warfarin. (Eventual DOAC)  3. CAD s/p CABG 2015 - having occasional chest tightness (angina vs  HF?) - continue statin. - off ASA with warfarin  4. Morbid obesity - Body mass index is 49.24 kg/m. - Consider GLP1RA - Will need sleep study  5. CKD 3b - SCr 1.6 in 4/24 - labs today  RTC next week to re-assess   Toribio Fuel, MD  11:08 AM

## 2024-08-17 NOTE — Progress Notes (Signed)
 Medication Samples have been provided to the patient.  Drug name: Furoscix        Strength: 80mg         Qty: 3  LOT: [id number]  Exp.Date: 08/07/25  Dosing instructions: use kit as directed by HF clinic  The patient has been instructed regarding the correct time, dose, and frequency of taking this medication, including desired effects and most common side effects.   Laura Lane 12:35 PM 08/17/2024

## 2024-08-17 NOTE — Progress Notes (Signed)
 LATE ENTRY:   ReDS Vest / Clip - 08/17/24 1700       ReDS Vest / Clip   Station Marker A    Ruler Value 27    ReDS Value Range High volume overload    ReDS Actual Value 47

## 2024-08-17 NOTE — Progress Notes (Signed)
 Provided patient education on Furoscix using demo kits and Furoscix video, QR code provided on AVS for further viewing. Furoscix order submitted online, ov note and ins card uploaded to [employer].

## 2024-08-17 NOTE — Telephone Encounter (Signed)
 Advanced Heart Failure Patient Advocate Encounter  Prior authorization for Furoscix  has been submitted and approved. Test billing returns [phone] for 3 day supply.  [employer] [place] Effective: 08/17/2024 to 08/17/2025  Rachel DEL, CPhT Rx Patient Advocate Phone: [phone]

## 2024-08-17 NOTE — Addendum Note (Signed)
 Encounter addended by: Buell Powell HERO, RN on: 08/17/2024 5:15 PM  Actions taken: Flowsheet accepted, Clinical Note Signed, Diagnosis association updated, Order list changed, Charge Capture section accepted

## 2024-08-17 NOTE — Patient Instructions (Signed)
 Medication Changes:  STOP Carvedilol  START Amiodarone 200 mg Daily **Please let whoever manages your Coumadin  level know this was started  START Digoxin 0.0625 mg (1/2 tab) Daily   Your provider has order Furoscix  for you. This is an on-body infuser that gives you a dose of Furosemide .   It will be shipped to your home from [facility], they will call you before shipping  Ensure you write down the time you start your infusion so that if there is a problem you will know how long the infusion lasted  Use Furoscix  only AS DIRECTED by our office  Dosing Directions:   Day 1= Today Wed 12/10, use 1 Furoscix  Kit and take 40 meq (2 tabs) of Potassium, do not take Torsemide  Day 2= tomorrow, Tamara 12/11, use 1 Furoscix  Kit and take 40 meq (2 tabs) of Potassium, do not take Torsemide  Day 3= Fri 12/12, use 1 Furoscix  Kit and take 40 meq (2 tabs) of Potassium, do not take Torsemide  Day 4= Sat 12/13 START Torsemide 40 mg (2 tabs) Daily   Lab Work:  Labs done today, your results will be available in MyChart, we will contact you for abnormal readings.  Special Instructions // Education:  Do the following things EVERYDAY: Weigh yourself in the morning before breakfast. Write it down and keep it in a log. Take your medicines as prescribed Eat low salt foods--Limit salt (sodium) to 2000 mg per day.  Stay as active as you can everyday Limit all fluids for the day to less than 2 liters  Please wear your compression hose daily, place them on as soon as you get up in the morning and remove before you go to bed at night.   Follow-Up in: 1 week   At the [facility], you and your health needs are our priority. We have a designated team specialized in the treatment of Heart Failure. This Care Team includes your primary Heart Failure Specialized Cardiologist (physician), Advanced Practice Providers (APPs- Physician Assistants and Nurse Practitioners), and  Pharmacist who all work together to provide you with the care you need, when you need it.   You may see any of the following providers on your designated Care Team at your next follow up:  Dr. Toribio Fuel Dr. Ezra Shuck Dr. Odis Brownie Greig Mosses, NP Caffie Shed, [place] [facility] [place], [place] Beckey Coe, NP Jordan Lee, NP Tinnie Redman, PharmD   Please be sure to bring in all your medications bottles to every appointment.   Need to Contact Us :  If you have any questions or concerns before your next appointment please send us  a message through [facility] or call our office at [phone].    TO LEAVE A MESSAGE FOR THE NURSE SELECT OPTION 2, PLEASE LEAVE A MESSAGE INCLUDING: YOUR NAME DATE OF BIRTH CALL BACK NUMBER REASON FOR CALL**this is important as we prioritize the call backs  YOU WILL RECEIVE A CALL BACK THE SAME DAY AS LONG AS YOU CALL BEFORE 4:00 PM

## 2024-08-23 ENCOUNTER — Ambulatory Visit (HOSPITAL_COMMUNITY): Admission: RE | Admit: 2024-08-23 | Discharge: 2024-08-23 | Attending: Internal Medicine | Admitting: Internal Medicine

## 2024-08-23 ENCOUNTER — Encounter (HOSPITAL_COMMUNITY): Payer: Self-pay | Admitting: Internal Medicine

## 2024-08-23 VITALS — BP 104/0 | HR 68 | Ht <= 58 in | Wt 232.0 lb

## 2024-08-23 DIAGNOSIS — I5022 Chronic systolic (congestive) heart failure: Secondary | ICD-10-CM | POA: Diagnosis not present

## 2024-08-23 DIAGNOSIS — I480 Paroxysmal atrial fibrillation: Secondary | ICD-10-CM

## 2024-08-23 DIAGNOSIS — N183 Chronic kidney disease, stage 3 unspecified: Secondary | ICD-10-CM | POA: Diagnosis not present

## 2024-08-23 LAB — PRO BRAIN NATRIURETIC PEPTIDE: Pro Brain Natriuretic Peptide: 3655 pg/mL — ABNORMAL HIGH (ref <300.0)

## 2024-08-23 LAB — BASIC METABOLIC PANEL WITH GFR
Anion gap: 10 (ref 5–15)
BUN: 51 mg/dL — ABNORMAL HIGH (ref 8–23)
CO2: 29 mmol/L (ref 22–32)
Calcium: 9.7 mg/dL (ref 8.9–10.3)
Chloride: 99 mmol/L (ref 98–111)
Creatinine, Ser: 1.82 mg/dL — ABNORMAL HIGH (ref 0.44–1.00)
GFR, Estimated: 30 mL/min — ABNORMAL LOW (ref >60)
Glucose, Bld: 91 mg/dL (ref 70–99)
Potassium: 5.3 mmol/L — ABNORMAL HIGH (ref 3.5–5.1)
Sodium: 139 mmol/L (ref 135–145)

## 2024-08-23 LAB — DIGOXIN LEVEL: Digoxin Level: 0.9 ng/mL (ref 0.8–2.0)

## 2024-08-23 MED ORDER — TORSEMIDE 20 MG PO TABS: 40.0000 mg | ORAL_TABLET | Freq: Every day | ORAL | 6 refills | Status: AC

## 2024-08-23 NOTE — Progress Notes (Signed)
 Height:     Weight: BMI:  Today's Date:  STOP BANG RISK ASSESSMENT S (snore) Have you been told that you snore?     YES   T (tired) Are you often tired, fatigued, or sleepy during the day?   YES  O (obstruction) Do you stop breathing, choke, or gasp during sleep? NO   P (pressure) Do you have or are you being treated for high blood pressure? NO   B (BMI) Is your body index greater than 35 kg/m? YES   A (age) Are you 68 years old or older? YES   N (neck) Do you have a neck circumference greater than 16 inches?   YES   G (gender) Are you a female? NO   TOTAL STOP/BANG YES ANSWERS 5                                                                       For Office Use Only              Procedure Order Form    YES to 3+ Stop Bang questions OR two clinical symptoms - patient qualifies for WatchPAT (CPT 95800)      Clinical Notes: Will consult Sleep Specialist and refer for management of therapy due to patient increased risk of Sleep Apnea. Ordering a sleep study due to the following two clinical symptoms: Excessive daytime sleepiness G47.10 / Gastroesophageal reflux K21.9 / Nocturia R35.1 / Morning Headaches G44.221 / Difficulty concentrating R41.840 / Memory problems or poor judgment G31.84 / Personality changes or irritability R45.4 / Loud snoring R06.83 / Depression F32.9 / Unrefreshed by sleep G47.8 / Impotence N52.9 / History of high blood pressure R03.0 / Insomnia G47.00

## 2024-08-23 NOTE — Patient Instructions (Addendum)
 Medication Changes:  Can hold Torsemide  daily AS NEEDED  Lab Work:  Labs done today, your results will be available in MyChart, we will contact you for abnormal readings.   Testing/Procedures:  Your physician has recommended that you have a Cardioversion (DCCV). Electrical Cardioversion uses a jolt of electricity to your heart either through paddles or wired patches attached to your chest. This is a controlled, usually prescheduled, procedure. Defibrillation is done under light anesthesia in the hospital, and you usually go home the day of the procedure. This is done to get your heart back into a normal rhythm. You are not awake for the procedure. Please see the instructions below  Cardiac MRI has been ordered, you will be called to schedule this  Special Instructions // Education:  **WHEN YOU GET YOUR NEW INSURANCE CARD PLEASE CALL US  AS WE WILL NEED TO GET THIS INFORMATION ENTERED INTO YOUR CHART PRIOR TO YOUR PROCEDURE**  Do the following things EVERYDAY: Weigh yourself in the morning before breakfast. Write it down and keep it in a log. Take your medicines as prescribed Eat low salt foods--Limit salt (sodium) to 2000 mg per day.  Stay as active as you can everyday Limit all fluids for the day to less than 2 liters   CARDIOVERSION INSTRUCTIONS:    Dear Laura Lane  You are scheduled for a Cardioversion on Tuesday, January 6 with Dr. Bensimhon.    Please arrive at the [facility] (Main Entrance A) at [facility]: [address] [place], [place] [address] at 6:00 AM (This time is 1 & 1/2 hour(s) before your procedure to ensure your preparation).   Free valet parking service is available. You will check in at ADMITTING.   *Please Note: You will receive a call the day before your procedure to confirm the appointment time. That time may have changed from the original time based on the schedule for that day.*    DIET:  Nothing to eat or drink after midnight except a  sip of water with medications (see medication instructions below)  MEDICATION INSTRUCTIONS: !!IF ANY NEW MEDICATIONS ARE STARTED AFTER TODAY, PLEASE NOTIFY YOUR PROVIDER AS SOON AS POSSIBLE!!  FYI: Medications such as Semaglutide (Ozempic, Wegovy), Tirzepatide (Mounjaro, Zepbound), Dulaglutide (Trulicity), etc (GLP1 agonists) AND Canagliflozin (Invokana), Dapagliflozin (Farxiga), Empagliflozin (Jardiance), Ertugliflozin (Steglatro), Bexagliflozin [employer]) or any combination with one of these drugs such as Invokamet (Canagliflozin/Metformin), Synjardy (Empagliflozin/Metformin), etc (SGLT2 inhibitors) must be held around the time of a procedure. This is not a comprehensive list of all of these drugs. Please review all of your medications and talk to your provider if you take any one of these. If you are not sure, ask your provider.     Continue taking your anticoagulant (blood thinner): Warfarin (Coumadin ).   Tuesday 09/13/24 AM DO NOT TAKE: Torsemide  or Eplerenone  LABS: done today    FYI:  For your safety, and to allow us  to monitor your vital signs accurately during the surgery/procedure we request: If you have artificial nails, gel coating, SNS etc, please have those removed prior to your surgery/procedure. Not having the nail coverings /polish removed may result in cancellation or delay of your surgery/procedure.  Your support person will be asked to wait in the waiting room during your procedure.  It is OK to have someone drop you off and come back when you are ready to be discharged.  You cannot drive after the procedure and will need someone to drive you home.  Bring your  insurance cards.  *Special Note: Every effort is made to have your procedure done on time. Occasionally there are emergencies that occur at the hospital that may cause delays. Please be patient if a delay does occur.     You are scheduled for Cardiac MRI at the location below.  Please arrive for your appointment  at ______________ . ?  [facility] [address] [place], [place] [address] Please take advantage of the free valet parking available at the [facility] [employer] (Entrance C).  Proceed to the [facility] Radiology Department (First Floor) for check-in.   Magnetic resonance imaging (MRI) is a painless test that produces images of the inside of the body without using Xrays.  During an MRI, strong magnets and radio waves work together in a data processing manager to form detailed images.   MRI images may provide more details about a medical condition than X-rays, CT scans, and ultrasounds can provide.  You may be given earphones to listen for instructions.  You may eat a light breakfast and take medications as ordered with the exception of furosemide , hydrochlorothiazide, chlorthalidone or spironolactone (or any other fluid pill). If you are undergoing a stress MRI, please avoid stimulants for 12 hr prior to test. (I.e. Caffeine, nicotine, chocolate, or antihistamine medications)  If your provider has ordered anti-anxiety medications for this test, then you will need a driver.  An IV will be inserted into one of your veins. Contrast material will be injected into your IV. It will leave your body through your urine within a day. You may be told to drink plenty of fluids to help flush the contrast material out of your system.  You will be asked to remove all metal, including: Watch, jewelry, and other metal objects including hearing aids, hair pieces and dentures. Also wearable glucose monitoring systems (ie. Freestyle Libre and Omnipods) (Braces and fillings normally are not a problem.)   TEST WILL TAKE APPROXIMATELY 1 HOUR  PLEASE NOTIFY SCHEDULING AT LEAST 24 HOURS IN ADVANCE IF YOU ARE UNABLE TO KEEP YOUR APPOINTMENT. [phone]  For more information and frequently asked questions, please visit our website : [email or web address]  Please call the Cardiac  Imaging Nurse Navigators with any questions/concerns. [phone] Office   Follow-Up in: 3 months   At the [facility], you and your health needs are our priority. We have a designated team specialized in the treatment of Heart Failure. This Care Team includes your primary Heart Failure Specialized Cardiologist (physician), Advanced Practice Providers (APPs- Physician Assistants and Nurse Practitioners), and Pharmacist who all work together to provide you with the care you need, when you need it.   You may see any of the following providers on your designated Care Team at your next follow up:  Dr. Toribio Fuel Dr. Ezra Shuck Dr. Odis Brownie Greig Mosses, NP Caffie Shed, [place] [facility] [place], [place] Beckey Coe, NP Jordan Lee, NP Tinnie Redman, PharmD   Please be sure to bring in all your medications bottles to every appointment.   Need to Contact Us :  If you have any questions or concerns before your next appointment please send us  a message through [place] or call our office at [phone].    TO LEAVE A MESSAGE FOR THE NURSE SELECT OPTION 2, PLEASE LEAVE A MESSAGE INCLUDING: YOUR NAME DATE OF BIRTH CALL BACK NUMBER REASON FOR CALL**this is important as we prioritize the call backs  YOU WILL RECEIVE A CALL BACK THE SAME DAY AS  LONG AS YOU CALL BEFORE 4:00 PM

## 2024-08-23 NOTE — Progress Notes (Signed)
 Patient is getting new insurance- active 09/08/2024-- patient preferred to wait until next apt to do Itamar sleep testing. Reviewed all instructions and education with patient, will give to her at next OV in January 2026.   Itamar-- unregistered and returned to stock.

## 2024-08-23 NOTE — H&P (View-Only) (Signed)
 "  [facility] NEW PATIENT NOTE  Referring Physician: Orpha Yancey LABOR, MD Primary Care: Orpha Yancey LABOR, MD Primary Cardiologist: None  Chief Complaint:    HPI:  Laura Lane is a 68 y/o woman with morbid obesity. COPD (quit 2015), PAF, CKD 3b, CAD s/p CABG and systolic HF presents for a second opinion regarding her HF  Admitted to [facility] with NSTEMI. Found to have CAD. Was undergoing PCI and had coronary rupture and went to emergent CABG. Had prolonged hospitalization with CVVHD and tracheostomy support. Subsequently lost to Cardiology f/u.   Admitted to [facility] in 4/24 for HF in the setting of new AV. Echo in 4/24 showed EF 15-20%.Underwent DC-CV and medical management.   Has not had routine cardiology f/u since.   PCP did some screening in 10/25  and told her that her numbers weren't OK and got echo and ECG. ECG apparently showed AF. Echo at [facility] on 06/23/24 EF 20-25% G1DD RV severely reduced no significant valvular disease  I saw her last week for initial cardiology evaluation. Markedly fluid overloaded ReDS 47% We did Furoscix  x 3. Torsemide  increased from 20 daily to 40 daily. Digoxin  and amio added. Carvedilol stopped. Weight down 10 pounds at home. Breathing much better. Edema resolved. Still SOB with mild exertion   ReDS today 25%   Past Medical History:  Diagnosis Date   Cataracts, bilateral    CHF (congestive heart failure) (HCC)    COPD (chronic obstructive pulmonary disease) (HCC)    Hx of CABG    Hypothyroid    MI (myocardial infarction) (HCC)    Renal disorder    Thyroid  disease     Current Outpatient Medications  Medication Sig Dispense Refill   acetaminophen  (TYLENOL ) 650 MG CR tablet Take 650-1,300 mg by mouth every 8 (eight) hours as needed for pain.     Albuterol Sulfate (PROAIR RESPICLICK) 108 (90 Base) MCG/ACT AEPB Inhale 2 puffs into the lungs every 6 (six) hours as needed (shortness of breath).     ALPRAZolam  (XANAX ) 1 MG tablet Take 1 mg by  mouth at bedtime.     amiodarone  (PACERONE ) 200 MG tablet Take 1 tablet (200 mg total) by mouth daily. 30 tablet 3   Calcium  Citrate-Vitamin D 250-2.5 MG-MCG TABS Take 1 mg by mouth.     cetirizine (ZYRTEC) 10 MG tablet Take 10 mg by mouth daily.     clobetasol cream (TEMOVATE) 0.05 % Apply 1 application topically daily as needed (psoriasis).     digoxin  (LANOXIN ) 0.125 MG tablet Take 0.5 tablets (0.0625 mg total) by mouth daily. 15 tablet 3   eplerenone (INSPRA) 25 MG tablet Take 25 mg by mouth daily.     Fluticasone Propionate, Inhal, 250 MCG/ACT AEPB Inhale into the lungs in the morning and at bedtime.     Lactobacillus-Inulin (CULTURELLE DIGESTIVE DAILY PO) Take 1 capsule by mouth daily at 6 (six) AM.     levothyroxine  (SYNTHROID ) 100 MCG tablet Take 100 mcg by mouth daily before breakfast.     methocarbamol (ROBAXIN) 500 MG tablet Take 500 mg by mouth daily as needed for muscle spasms.     olmesartan (BENICAR) 20 MG tablet Take 10 mg by mouth daily.     potassium chloride  SA (KLOR-CON  M) 20 MEQ tablet Take 2 tablets (40 mEq total) by mouth daily as needed (when you use Furoscix ). 6 tablet 0   rosuvastatin (CRESTOR) 20 MG tablet Take 20 mg by mouth daily.     torsemide  (  DEMADEX ) 20 MG tablet Take 2 tablets (40 mg total) by mouth daily. 60 tablet 6   triamcinolone cream (KENALOG) 0.1 % Apply 1 application topically daily as needed (itching).     warfarin (COUMADIN ) 5 MG tablet Take 5 mg by mouth daily.     cholecalciferol (VITAMIN D3) 25 MCG (1000 UNIT) tablet Take 1,000 Units by mouth. (Patient not taking: Reported on 08/23/2024)     cyanocobalamin (VITAMIN B12) 1000 MCG tablet Take 1,000 mcg by mouth. (Patient not taking: Reported on 08/23/2024)     No current facility-administered medications for this encounter.    Allergies  Allergen Reactions   Boniva [Ibandronate]    Losartan Other (See Comments)    Unknown reaction.    Zofran  [Ondansetron ] Nausea And Vomiting and Other (See  Comments)    headaches      Social History   Socioeconomic History   Marital status: Married    Spouse name: Not on file   Number of children: Not on file   Years of education: Not on file   Highest education level: Not on file  Occupational History   Not on file  Tobacco Use   Smoking status: Former    Current packs/day: 0.00    Types: Cigarettes    Start date: 08/17/1972    Quit date: 02/06/2014    Years since quitting: 10.5    Passive exposure: Past   Smokeless tobacco: Never  Vaping Use   Vaping status: Never Used  Substance and Sexual Activity   Alcohol use: No    Alcohol/week: 0.0 standard drinks of alcohol   Drug use: No   Sexual activity: Not on file  Other Topics Concern   Not on file  Social History Narrative   Not on file   Social Drivers of Health   Tobacco Use: Medium Risk (08/23/2024)   Patient History    Smoking Tobacco Use: Former    Smokeless Tobacco Use: Never    Passive Exposure: Past  Physicist, Medical Strain: Not on file  Food Insecurity: Low Risk (12/10/2022)   Received from [facility]   Epic    Within the past 12 months, you worried that your food would run out before you got money to buy more: Never true    Within the past 12 months, the food you bought just didn't last and you didn't have money to get more. : Never true  Transportation Needs: No Transportation Needs (12/10/2022)   Received from [facility]    In the past 12 months, has lack of reliable transportation kept you from medical appointments, meetings, work or from getting things needed for daily living? : No  Physical Activity: Not on file  Stress: Not on file  Social Connections: Unknown (01/20/2022)   Received from [facility]   Social Network    Social Network: Not on file  Intimate Partner Violence: Unknown (12/12/2021)   Received from [facility]   HITS    Physically Hurt: Not on file    Insult or Talk Down To: Not on file    Threaten Physical  Harm: Not on file    Scream or Curse: Not on file  Depression (EYV7-0): Not on file  Alcohol Screen: Not on file  Housing: Low Risk (12/10/2022)   Received from [facility]   Epic    What is your living situation today?: I have a steady place to live    Think about the place you live. Do you  have problems with any of the following? Choose all that apply:: None/None on this list  Utilities: Low Risk (12/10/2022)   Received from [facility]   Utilities    In the past 12 months has the electric, gas, oil, or water company threatened to shut off services in your home? : No  Health Literacy: Not on file      Family History  Problem Relation Age of Onset   Lung disease Father    Breast cancer Mother    Kidney disease Mother    CAD Neg Hx     Vitals:   08/23/24 1109 08/23/24 1216  BP: (!) 90/58 (!) 104/0  Pulse: 68   SpO2: 96%   Weight: 105.2 kg (232 lb)   Height: 4' 10 (1.473 m)     PHYSICAL EXAM: General:  Sitting in chair No resp difficulty HEENT: normal Neck: supple. no JVD.  Cor: Irregular rate & rhythm. No rubs, gallops or murmurs. Lungs: clear Abdomen:  obese soft, nontender, nondistended.Good bowel sounds. Extremities: no cyanosis, clubbing, rash, edema Neuro: alert & orientedx3, cranial nerves grossly intact. moves all 4 extremities w/o difficulty. Affect pleasant  ECG: AF 111 + 1 PVC nonspecific ST. Narrow QRS Personally reviewed    ReDS 47% -> 25%  ASSESSMENT & PLAN:  1. Chronic systolic HF with biventricular dysfunction - suspect mostly iCM. S/p CABG 2015. But may also have component of NICM - Echo 10/25 EF 20-25% G1 DD RV severely HK. Valves ok. [facility]) - Volume status much improved after Furoscix  - now a bit dry, BP soft. (Checked personally with Doppler) - Continue digoxin  0.0625mg  daily - Continue eplerenone 25 - Continue olmesartan 10 - Continue torsemide  40 daily - instructed her to hold for 1-2 days and any time she feels lightheaded -  Eventual SGLT2i - Carvedilol stopped due to potential low output snd low BP - Continue compression hose - Will plan DC-CV and cMRI - If EF not improving with restoration of NSR or MRI suggests ischemic substrate will need R/L cath - Labs today  2. Persistent AF - duration unknown - given severity of HF will try to get back in NSR - Continue amio 200 daily - continue warfarin. (Eventual DOAC) - Plan DC-CV as above. If INR has been subtherapeutic will need TEE.   3. CAD s/p CABG 2015 - having occasional chest tightness (angina vs HF?) but not progressive or reproducible - continue statin. - off ASA with warfarin - Possible cath as described above  4. Morbid obesity - Body mass index is 48.49 kg/m. - Consider GLP1RA - Will need sleep study  5. CKD 3b - SCr 1.6 in 4/24 - Repeat labs today  I spent a total of 45 minutes today: 1) reviewing the patient's medical records including previous charts, labs and recent notes from other providers; 2) examining the patient and counseling them on their medical issues/explaining the plan of care; 3) adjusting meds as needed and 4) ordering lab work or other needed tests.   Toribio Fuel, MD  12:16 PM  "

## 2024-08-23 NOTE — Progress Notes (Signed)
 "  ADVANCED HF CLINIC NEW PATIENT NOTE  Referring Physician: Orpha Yancey LABOR, MD Primary Care: Orpha Yancey LABOR, MD Primary Cardiologist: None  Chief Complaint:    HPI:  Laura Lane is a 68 y/o woman with morbid obesity. COPD (quit 2015), PAF, CKD 3b, CAD s/p CABG and systolic HF presents for a second opinion regarding her HF  Admitted to [facility] with NSTEMI. Found to have CAD. Was undergoing PCI and had coronary rupture and went to emergent CABG. Had prolonged hospitalization with CVVHD and tracheostomy support. Subsequently lost to Cardiology f/u.   Admitted to [facility] in 4/24 for HF in the setting of new AV. Echo in 4/24 showed EF 15-20%.Underwent DC-CV and medical management.   Has not had routine cardiology f/u since.   PCP did some screening in 10/25  and told her that her numbers weren't OK and got echo and ECG. ECG apparently showed AF. Echo at [facility] on 06/23/24 EF 20-25% G1DD RV severely reduced no significant valvular disease  I saw her last week for initial cardiology evaluation. Markedly fluid overloaded ReDS 47% We did Furoscix  x 3. Torsemide  increased from 20 daily to 40 daily. Digoxin  and amio added. Carvedilol stopped. Weight down 10 pounds at home. Breathing much better. Edema resolved. Still SOB with mild exertion   ReDS today 25%   Past Medical History:  Diagnosis Date   Cataracts, bilateral    CHF (congestive heart failure) (HCC)    COPD (chronic obstructive pulmonary disease) (HCC)    Hx of CABG    Hypothyroid    MI (myocardial infarction) (HCC)    Renal disorder    Thyroid  disease     Current Outpatient Medications  Medication Sig Dispense Refill   acetaminophen  (TYLENOL ) 650 MG CR tablet Take 650-1,300 mg by mouth every 8 (eight) hours as needed for pain.     Albuterol Sulfate (PROAIR RESPICLICK) 108 (90 Base) MCG/ACT AEPB Inhale 2 puffs into the lungs every 6 (six) hours as needed (shortness of breath).     ALPRAZolam  (XANAX ) 1 MG tablet Take 1 mg by  mouth at bedtime.     amiodarone  (PACERONE ) 200 MG tablet Take 1 tablet (200 mg total) by mouth daily. 30 tablet 3   Calcium  Citrate-Vitamin D 250-2.5 MG-MCG TABS Take 1 mg by mouth.     cetirizine (ZYRTEC) 10 MG tablet Take 10 mg by mouth daily.     clobetasol cream (TEMOVATE) 0.05 % Apply 1 application topically daily as needed (psoriasis).     digoxin  (LANOXIN ) 0.125 MG tablet Take 0.5 tablets (0.0625 mg total) by mouth daily. 15 tablet 3   eplerenone (INSPRA) 25 MG tablet Take 25 mg by mouth daily.     Fluticasone Propionate, Inhal, 250 MCG/ACT AEPB Inhale into the lungs in the morning and at bedtime.     Lactobacillus-Inulin (CULTURELLE DIGESTIVE DAILY PO) Take 1 capsule by mouth daily at 6 (six) AM.     levothyroxine  (SYNTHROID ) 100 MCG tablet Take 100 mcg by mouth daily before breakfast.     methocarbamol (ROBAXIN) 500 MG tablet Take 500 mg by mouth daily as needed for muscle spasms.     olmesartan (BENICAR) 20 MG tablet Take 10 mg by mouth daily.     potassium chloride  SA (KLOR-CON  M) 20 MEQ tablet Take 2 tablets (40 mEq total) by mouth daily as needed (when you use Furoscix ). 6 tablet 0   rosuvastatin (CRESTOR) 20 MG tablet Take 20 mg by mouth daily.     torsemide  (  DEMADEX ) 20 MG tablet Take 2 tablets (40 mg total) by mouth daily. 60 tablet 6   triamcinolone cream (KENALOG) 0.1 % Apply 1 application topically daily as needed (itching).     warfarin (COUMADIN ) 5 MG tablet Take 5 mg by mouth daily.     cholecalciferol (VITAMIN D3) 25 MCG (1000 UNIT) tablet Take 1,000 Units by mouth. (Patient not taking: Reported on 08/23/2024)     cyanocobalamin (VITAMIN B12) 1000 MCG tablet Take 1,000 mcg by mouth. (Patient not taking: Reported on 08/23/2024)     No current facility-administered medications for this encounter.    Allergies  Allergen Reactions   Boniva [Ibandronate]    Losartan Other (See Comments)    Unknown reaction.    Zofran  [Ondansetron ] Nausea And Vomiting and Other (See  Comments)    headaches      Social History   Socioeconomic History   Marital status: Married    Spouse name: Not on file   Number of children: Not on file   Years of education: Not on file   Highest education level: Not on file  Occupational History   Not on file  Tobacco Use   Smoking status: Former    Current packs/day: 0.00    Types: Cigarettes    Start date: 08/17/1972    Quit date: 02/06/2014    Years since quitting: 10.5    Passive exposure: Past   Smokeless tobacco: Never  Vaping Use   Vaping status: Never Used  Substance and Sexual Activity   Alcohol use: No    Alcohol/week: 0.0 standard drinks of alcohol   Drug use: No   Sexual activity: Not on file  Other Topics Concern   Not on file  Social History Narrative   Not on file   Social Drivers of Health   Tobacco Use: Medium Risk (08/23/2024)   Patient History    Smoking Tobacco Use: Former    Smokeless Tobacco Use: Never    Passive Exposure: Past  Physicist, Medical Strain: Not on file  Food Insecurity: Low Risk (12/10/2022)   Received from [facility]   Epic    Within the past 12 months, you worried that your food would run out before you got money to buy more: Never true    Within the past 12 months, the food you bought just didn't last and you didn't have money to get more. : Never true  Transportation Needs: No Transportation Needs (12/10/2022)   Received from [facility]    In the past 12 months, has lack of reliable transportation kept you from medical appointments, meetings, work or from getting things needed for daily living? : No  Physical Activity: Not on file  Stress: Not on file  Social Connections: Unknown (01/20/2022)   Received from [facility]   Social Network    Social Network: Not on file  Intimate Partner Violence: Unknown (12/12/2021)   Received from [facility]   HITS    Physically Hurt: Not on file    Insult or Talk Down To: Not on file    Threaten Physical  Harm: Not on file    Scream or Curse: Not on file  Depression (EYV7-0): Not on file  Alcohol Screen: Not on file  Housing: Low Risk (12/10/2022)   Received from [facility]   Epic    What is your living situation today?: I have a steady place to live    Think about the place you live. Do you  have problems with any of the following? Choose all that apply:: None/None on this list  Utilities: Low Risk (12/10/2022)   Received from [facility]   Utilities    In the past 12 months has the electric, gas, oil, or water company threatened to shut off services in your home? : No  Health Literacy: Not on file      Family History  Problem Relation Age of Onset   Lung disease Father    Breast cancer Mother    Kidney disease Mother    CAD Neg Hx     Vitals:   08/23/24 1109 08/23/24 1216  BP: (!) 90/58 (!) 104/0  Pulse: 68   SpO2: 96%   Weight: 105.2 kg (232 lb)   Height: 4' 10 (1.473 m)     PHYSICAL EXAM: General:  Sitting in chair No resp difficulty HEENT: normal Neck: supple. no JVD.  Cor: Irregular rate & rhythm. No rubs, gallops or murmurs. Lungs: clear Abdomen:  obese soft, nontender, nondistended.Good bowel sounds. Extremities: no cyanosis, clubbing, rash, edema Neuro: alert & orientedx3, cranial nerves grossly intact. moves all 4 extremities w/o difficulty. Affect pleasant  ECG: AF 111 + 1 PVC nonspecific ST. Narrow QRS Personally reviewed    ReDS 47% -> 25%  ASSESSMENT & PLAN:  1. Chronic systolic HF with biventricular dysfunction - suspect mostly iCM. S/p CABG 2015. But may also have component of NICM - Echo 10/25 EF 20-25% G1 DD RV severely HK. Valves ok. [facility]) - Volume status much improved after Furoscix  - now a bit dry, BP soft. (Checked personally with Doppler) - Continue digoxin  0.0625mg  daily - Continue eplerenone 25 - Continue olmesartan 10 - Continue torsemide  40 daily - instructed her to hold for 1-2 days and any time she feels lightheaded -  Eventual SGLT2i - Carvedilol stopped due to potential low output snd low BP - Continue compression hose - Will plan DC-CV and cMRI - If EF not improving with restoration of NSR or MRI suggests ischemic substrate will need R/L cath - Labs today  2. Persistent AF - duration unknown - given severity of HF will try to get back in NSR - Continue amio 200 daily - continue warfarin. (Eventual DOAC) - Plan DC-CV as above. If INR has been subtherapeutic will need TEE.   3. CAD s/p CABG 2015 - having occasional chest tightness (angina vs HF?) but not progressive or reproducible - continue statin. - off ASA with warfarin - Possible cath as described above  4. Morbid obesity - Body mass index is 48.49 kg/m. - Consider GLP1RA - Will need sleep study  5. CKD 3b - SCr 1.6 in 4/24 - Repeat labs today  I spent a total of 45 minutes today: 1) reviewing the patient's medical records including previous charts, labs and recent notes from other providers; 2) examining the patient and counseling them on their medical issues/explaining the plan of care; 3) adjusting meds as needed and 4) ordering lab work or other needed tests.   Toribio Fuel, MD  12:16 PM  "

## 2024-08-23 NOTE — Progress Notes (Signed)
 ReDS Vest / Clip - 08/23/24 1140       ReDS Vest / Clip   Station Marker A    Ruler Value 33    ReDS Value Range Low volume    ReDS Actual Value 25

## 2024-08-23 NOTE — Progress Notes (Signed)
 ITAMAR home sleep study given to patient, all instructions explained, waiver signed, and CLOUDPAT registration complete.

## 2024-08-24 ENCOUNTER — Ambulatory Visit (HOSPITAL_COMMUNITY): Payer: Self-pay | Admitting: Internal Medicine

## 2024-08-24 DIAGNOSIS — I5022 Chronic systolic (congestive) heart failure: Secondary | ICD-10-CM

## 2024-08-31 ENCOUNTER — Other Ambulatory Visit: Payer: Self-pay

## 2024-08-31 ENCOUNTER — Encounter (HOSPITAL_COMMUNITY): Payer: Self-pay

## 2024-08-31 ENCOUNTER — Emergency Department (HOSPITAL_COMMUNITY)

## 2024-08-31 ENCOUNTER — Emergency Department (HOSPITAL_COMMUNITY)
Admission: EM | Admit: 2024-08-31 | Discharge: 2024-08-31 | Disposition: A | Discharge status: Home / Self Care | Attending: Emergency Medicine | Admitting: Emergency Medicine

## 2024-08-31 DIAGNOSIS — I5023 Acute on chronic systolic (congestive) heart failure: Secondary | ICD-10-CM | POA: Diagnosis not present

## 2024-08-31 DIAGNOSIS — R7309 Other abnormal glucose: Secondary | ICD-10-CM | POA: Diagnosis not present

## 2024-08-31 DIAGNOSIS — R072 Precordial pain: Secondary | ICD-10-CM | POA: Diagnosis present

## 2024-08-31 LAB — CBC
HCT: 41.8 % (ref 36.0–46.0)
Hemoglobin: 13.5 g/dL (ref 12.0–15.0)
MCH: 31.8 pg (ref 26.0–34.0)
MCHC: 32.3 g/dL (ref 30.0–36.0)
MCV: 98.4 fL (ref 80.0–100.0)
Platelets: 230 K/uL (ref 150–400)
RBC: 4.25 MIL/uL (ref 3.87–5.11)
RDW: 13.8 % (ref 11.5–15.5)
WBC: 10.9 K/uL — ABNORMAL HIGH (ref 4.0–10.5)
nRBC: 0 % (ref 0.0–0.2)

## 2024-08-31 LAB — COMPREHENSIVE METABOLIC PANEL WITH GFR
ALT: 19 U/L (ref 0–44)
AST: 27 U/L (ref 15–41)
Albumin: 4.2 g/dL (ref 3.5–5.0)
Alkaline Phosphatase: 70 U/L (ref 38–126)
Anion gap: 14 (ref 5–15)
BUN: 36 mg/dL — ABNORMAL HIGH (ref 8–23)
CO2: 22 mmol/L (ref 22–32)
Calcium: 9.6 mg/dL (ref 8.9–10.3)
Chloride: 106 mmol/L (ref 98–111)
Creatinine, Ser: 1.53 mg/dL — ABNORMAL HIGH (ref 0.44–1.00)
GFR, Estimated: 37 mL/min — ABNORMAL LOW
Glucose, Bld: 135 mg/dL — ABNORMAL HIGH (ref 70–99)
Potassium: 4.9 mmol/L (ref 3.5–5.1)
Sodium: 142 mmol/L (ref 135–145)
Total Bilirubin: 0.4 mg/dL (ref 0.0–1.2)
Total Protein: 7.2 g/dL (ref 6.5–8.1)

## 2024-08-31 LAB — TROPONIN T, HIGH SENSITIVITY
Troponin T High Sensitivity: 25 ng/L — ABNORMAL HIGH (ref 0–19)
Troponin T High Sensitivity: 25 ng/L — ABNORMAL HIGH (ref 0–19)

## 2024-08-31 LAB — DIGOXIN LEVEL: Digoxin Level: 1.1 ng/mL (ref 0.8–2.0)

## 2024-08-31 LAB — PROTIME-INR
INR: 1.6 — ABNORMAL HIGH (ref 0.8–1.2)
Prothrombin Time: 19.9 s — ABNORMAL HIGH (ref 11.4–15.2)

## 2024-08-31 LAB — CBG MONITORING, ED: Glucose-Capillary: 132 mg/dL — ABNORMAL HIGH (ref 70–99)

## 2024-08-31 LAB — LIPASE, BLOOD: Lipase: 52 U/L — ABNORMAL HIGH (ref 11–51)

## 2024-08-31 LAB — PRO BRAIN NATRIURETIC PEPTIDE: Pro Brain Natriuretic Peptide: 5974 pg/mL — ABNORMAL HIGH

## 2024-08-31 LAB — MAGNESIUM: Magnesium: 2 mg/dL (ref 1.7–2.4)

## 2024-08-31 MED ORDER — ASPIRIN 81 MG PO CHEW
324.0000 mg | CHEWABLE_TABLET | Freq: Once | ORAL | Status: AC
  Administered 2024-08-31: 324 mg via ORAL
  Filled 2024-08-31: qty 4

## 2024-08-31 MED ORDER — FUROSEMIDE 10 MG/ML IJ SOLN
60.0000 mg | Freq: Once | INTRAMUSCULAR | Status: AC
  Administered 2024-08-31: 60 mg via INTRAVENOUS
  Filled 2024-08-31: qty 6

## 2024-08-31 NOTE — ED Triage Notes (Signed)
 Patient presents POV from home c/c chest pain since waking up this morning. States she has tried to belch without relief and does not believe this is GERD. Denies vomiting/diarrhea.

## 2024-08-31 NOTE — Discharge Instructions (Signed)
 As discussed, for the next 5 days take 60 mg total of your torsemide .  Cardiology should contact you in the coming days for follow-up.  If they do not do so, please contact them for follow-up next week. Return here for concerning changes in your condition.

## 2024-08-31 NOTE — ED Provider Notes (Signed)
 " [place] EMERGENCY DEPARTMENT AT [facility] Provider Note   CSN: [id number] Arrival date & time: 08/31/24  9079     Patient presents with: Chest Pain   Laura Lane is a 68 y.o. female.   HPI Obese elderly female with cardiomyopathy, A-fib presents with chest pain. Onset was 9 hours ago.  Since that time she has had pain sternal, nonradiating, sore, not relieved with anything.    Prior to Admission medications  Medication Sig Start Date End Date Taking? Authorizing Provider  acetaminophen  (TYLENOL ) 650 MG CR tablet Take 650-1,300 mg by mouth every 8 (eight) hours as needed for pain.   Yes [provider]  Albuterol Sulfate (PROAIR RESPICLICK) 108 (90 Base) MCG/ACT AEPB Inhale 2 puffs into the lungs every 6 (six) hours as needed (shortness of breath).   Yes [provider]  ALPRAZolam  (XANAX ) 1 MG tablet Take 1 mg by mouth at bedtime.   Yes [provider]  amiodarone  (PACERONE ) 200 MG tablet Take 1 tablet (200 mg total) by mouth daily. 08/17/24  Yes Bensimhon, Toribio SAUNDERS, MD  Calcium  Citrate-Vitamin D 250-2.5 MG-MCG TABS Take 1 mg by mouth.   Yes [provider]  cetirizine (ZYRTEC) 10 MG tablet Take 10 mg by mouth daily. 12/07/23  Yes [provider]  cholecalciferol (VITAMIN D3) 25 MCG (1000 UNIT) tablet Take 1,000 Units by mouth.   Yes [provider]  clobetasol cream (TEMOVATE) 0.05 % Apply 1 application topically daily as needed (psoriasis).   Yes [provider]  cyanocobalamin (VITAMIN B12) 1000 MCG tablet Take 1,000 mcg by mouth.   Yes [provider]  digoxin  (LANOXIN ) 0.125 MG tablet Take 0.5 tablets (0.0625 mg total) by mouth daily. 08/17/24  Yes Bensimhon, Toribio SAUNDERS, MD  eplerenone (INSPRA) 25 MG tablet Take 25 mg by mouth daily.   Yes [provider]  Fluticasone Propionate, Inhal, 250 MCG/ACT AEPB Inhale into the lungs in the morning and at bedtime.   Yes [provider]  Lactobacillus-Inulin (CULTURELLE DIGESTIVE DAILY PO) Take 1 capsule by mouth daily at 6 (six) AM.   Yes [provider]  levothyroxine  (SYNTHROID ) 100 MCG tablet Take 100 mcg by mouth daily before breakfast.   Yes [provider]  methocarbamol (ROBAXIN) 500 MG tablet Take 500 mg by mouth daily as needed for muscle spasms. 08/06/21  Yes [provider]  olmesartan (BENICAR) 20 MG tablet Take 10 mg by mouth daily.   Yes [provider]  rosuvastatin (CRESTOR) 20 MG tablet Take 20 mg by mouth daily.   Yes [provider]  torsemide  (DEMADEX ) 20 MG tablet Take 2 tablets (40 mg total) by mouth daily. Can hold as needed 08/23/24  Yes Bensimhon, Daniel R, MD  triamcinolone cream (KENALOG) 0.1 % Apply 1 application topically daily as needed (itching).   Yes [provider]  warfarin (COUMADIN ) 5 MG tablet Take 5 mg by mouth daily. 05/14/24  Yes [provider]    Allergies: Boniva [ibandronate], Losartan, and Zofran  [ondansetron ]    Review of Systems  Updated Vital Signs BP 117/77   Pulse 85   Temp 97.8 F (36.6 C) (Oral)   Resp 17   Ht 1.473 m (4' 10)   Wt 103.9 kg   SpO2 94%   BMI 47.86 kg/m   Physical Exam Vitals and nursing note reviewed.  Constitutional:      General: She is not in acute distress.    Appearance: She is  well-developed. She is obese.  HENT:     Head: Normocephalic and atraumatic.  Eyes:     Conjunctiva/sclera: Conjunctivae normal.  Cardiovascular:     Rate and Rhythm: Tachycardia present. Rhythm irregular.  Pulmonary:     Effort: Pulmonary effort is normal. No respiratory distress.     Breath sounds: Normal breath sounds. No stridor.  Abdominal:     General: There is no distension.  Musculoskeletal:     Right lower leg: Edema present.     Left lower leg: Edema present.  Skin:    General: Skin is warm and dry.  Neurological:     Mental Status: She is alert and oriented to person, place, and  time.     Cranial Nerves: No cranial nerve deficit.  Psychiatric:        Mood and Affect: Mood normal.     (all labs ordered are listed, but only abnormal results are displayed) Labs Reviewed  CBC - Abnormal; Notable for the following components:      Result Value   WBC 10.9 (*)    All other components within normal limits  COMPREHENSIVE METABOLIC PANEL WITH GFR - Abnormal; Notable for the following components:   Glucose, Bld 135 (*)    BUN 36 (*)    Creatinine, Ser 1.53 (*)    GFR, Estimated 37 (*)    All other components within normal limits  LIPASE, BLOOD - Abnormal; Notable for the following components:   Lipase 52 (*)    All other components within normal limits  PRO BRAIN NATRIURETIC PEPTIDE - Abnormal; Notable for the following components:   Pro Brain Natriuretic Peptide 5,974.0 (*)    All other components within normal limits  PROTIME-INR - Abnormal; Notable for the following components:   Prothrombin Time 19.9 (*)    INR 1.6 (*)    All other components within normal limits  CBG MONITORING, ED - Abnormal; Notable for the following components:   Glucose-Capillary 132 (*)    All other components within normal limits  TROPONIN T, HIGH SENSITIVITY - Abnormal; Notable for the following components:   Troponin T High Sensitivity 25 (*)    All other components within normal limits  TROPONIN T, HIGH SENSITIVITY - Abnormal; Notable for the following components:   Troponin T High Sensitivity 25 (*)    All other components within normal limits  MAGNESIUM  DIGOXIN  LEVEL    EKG: EKG Interpretation Date/Time:  Wednesday August 31 2024 09:33:56 EST Ventricular Rate:  100 PR Interval:    QRS Duration:  100 QT Interval:  349 QTC Calculation: 451 R Axis:   -12  Text Interpretation: Atrial fibrillation Abnormal R-wave progression, early transition ST-T wave changes consistent with prior from 8 days ago Reconfirmed by Garrick Charleston [phone]) on 08/31/2024 9:41:00  AM  Radiology: [facility] Chest Portable 1 View Result Date: 08/31/2024 CLINICAL DATA:  Chest pain. EXAM: PORTABLE CHEST 1 VIEW COMPARISON:  June 23, 2014 FINDINGS: Multiple sternal wires and vascular clips are seen. The cardiac silhouette is mildly enlarged and unchanged in size. Mild areas of scarring and atelectasis are seen within the mid left lung and left lung base. A small left pleural effusion is noted. No pneumothorax is identified. Multilevel degenerative changes are present throughout the thoracic spine. IMPRESSION: 1. Evidence of prior median sternotomy/CABG. 2. Mild left mid lung and left basilar scarring and/or atelectasis. 3. Small left pleural effusion. Electronically Signed   By: Suzen Dials M.D.   On: 08/31/2024 10:07  Procedures   Medications Ordered in the ED  aspirin  chewable tablet 324 mg (324 mg Oral Given 08/31/24 0942)  furosemide  (LASIX ) injection 60 mg (60 mg Intravenous Given 08/31/24 1042)                                    Medical Decision Making Adult female with multiple medical issues including A-fib, heart failure, MI, now presents with chest pain.  On exam patient is awake and alert, afebrile, no increased work of breathing.  Broad differential including uncontrolled A-fib, ACS, worsening heart failure, gastroesophageal. Cardiac 110 A-fib abnormal Pulse ox 98% room air normal  Amount and/or Complexity of Data Reviewed Independent Historian: spouse External Data Reviewed: notes. Labs: ordered. Decision-making details documented in ED Course. Radiology: ordered and independent interpretation performed. Decision-making details documented in ED Course. ECG/medicine tests: ordered and independent interpretation performed. Decision-making details documented in ED Course.  Risk OTC drugs. Prescription drug management. Decision regarding hospitalization. Diagnosis or treatment significantly limited by social determinants of health.   1:27  PM Patient with 2 normal troponin, no delta, heart rate diminished, respiratory rate diminished, she has diuresed substantial amounts of fluid after initial findings were concern for heart failure exacerbation.  No evidence for ongoing coronary ischemia, no pneumonia, no bacteremia, sepsis patient again is markedly improved since arrival, comfortable with discharge, follow-up as an outpatient.  Cardiology follow-up will be expedited, patient will go home on an increased regimen of diuretics pending cardiology follow-up    Final diagnoses:  Acute on chronic systolic congestive heart failure (HCC)      Garrick Charleston, MD 08/31/24 1330  "

## 2024-09-06 ENCOUNTER — Encounter (HOSPITAL_COMMUNITY): Payer: Self-pay

## 2024-09-09 ENCOUNTER — Other Ambulatory Visit (HOSPITAL_COMMUNITY): Payer: Self-pay | Admitting: Internal Medicine

## 2024-09-09 ENCOUNTER — Ambulatory Visit (HOSPITAL_COMMUNITY)
Admission: RE | Admit: 2024-09-09 | Discharge: 2024-09-09 | Disposition: A | Discharge status: Home / Self Care | Source: Ambulatory Visit | Attending: Internal Medicine | Admitting: Internal Medicine

## 2024-09-09 DIAGNOSIS — I5022 Chronic systolic (congestive) heart failure: Secondary | ICD-10-CM

## 2024-09-09 MED ORDER — GADOBUTROL 1 MMOL/ML IV SOLN
13.0000 mL | Freq: Once | INTRAVENOUS | Status: AC | PRN
  Administered 2024-09-09: 13 mL via INTRAVENOUS

## 2024-09-10 NOTE — Addendum Note (Signed)
 Encounter addended by: Jun Osment R, MD on: 09/10/2024 9:24 PM  Actions taken: Clinical Note Signed, Level of Service modified, Visit diagnoses modified

## 2024-09-12 ENCOUNTER — Telehealth (HOSPITAL_COMMUNITY): Payer: Self-pay | Admitting: *Deleted

## 2024-09-12 NOTE — Progress Notes (Signed)
 Pt called for pre procedure instructions. Arrival time 0630 NPO after midnight explained Instructed to take am meds with sip of water and confirmed blood thinner consistency Instructed pt need for ride home tomorrow and have responsible adult with them for 24 hrs post procedure.

## 2024-09-12 NOTE — Telephone Encounter (Signed)
 Per Dr Cherrie pt was seen in ER 12/24 and INR was 1.6, pt sch for DCCV 1/6 will need to change to DCCV with TEE. New orders placed, pt is aware, instructions reviewed via phone, she states she has been taking coumadin  2.5 mg daily since 08/17/24

## 2024-09-13 ENCOUNTER — Encounter (HOSPITAL_COMMUNITY): Payer: Self-pay

## 2024-09-13 ENCOUNTER — Encounter (HOSPITAL_COMMUNITY)
Admission: RE | Disposition: A | Discharge status: Home / Self Care | Payer: Self-pay | Source: Home / Self Care | Attending: Internal Medicine

## 2024-09-13 ENCOUNTER — Ambulatory Visit (HOSPITAL_COMMUNITY)
Admission: RE | Admit: 2024-09-13 | Discharge: 2024-09-13 | Disposition: A | Discharge status: Home / Self Care | Attending: Internal Medicine | Admitting: Internal Medicine

## 2024-09-13 ENCOUNTER — Other Ambulatory Visit: Payer: Self-pay

## 2024-09-13 ENCOUNTER — Ambulatory Visit (HOSPITAL_COMMUNITY)

## 2024-09-13 ENCOUNTER — Encounter (HOSPITAL_COMMUNITY): Payer: Self-pay | Admitting: Internal Medicine

## 2024-09-13 ENCOUNTER — Ambulatory Visit: Admitting: Internal Medicine

## 2024-09-13 DIAGNOSIS — I4891 Unspecified atrial fibrillation: Secondary | ICD-10-CM

## 2024-09-13 DIAGNOSIS — Z7901 Long term (current) use of anticoagulants: Secondary | ICD-10-CM | POA: Insufficient documentation

## 2024-09-13 DIAGNOSIS — I340 Nonrheumatic mitral (valve) insufficiency: Secondary | ICD-10-CM

## 2024-09-13 LAB — PROTIME-INR
INR: 1.7 — ABNORMAL HIGH (ref 0.8–1.2)
Prothrombin Time: 21.1 s — ABNORMAL HIGH (ref 11.4–15.2)

## 2024-09-13 SURGERY — TRANSESOPHAGEAL ECHOCARDIOGRAM (TEE) (CATHLAB)
Anesthesia: Monitor Anesthesia Care

## 2024-09-13 MED ORDER — SODIUM CHLORIDE 0.9% FLUSH: 3.0000 mL | Freq: Two times a day (BID) | INTRAVENOUS | Status: DC

## 2024-09-13 MED ORDER — SODIUM CHLORIDE 0.9% FLUSH: 3.0000 mL | INTRAVENOUS | Status: DC | PRN

## 2024-09-13 MED ORDER — SODIUM CHLORIDE 0.9 % IV SOLN: 250.0000 mL | INTRAVENOUS | Status: DC | PRN

## 2024-09-13 NOTE — Anesthesia Preprocedure Evaluation (Signed)
"                                    Anesthesia Evaluation  Patient identified by MRN, date of birth, ID band Patient awake    Reviewed: Allergy & Precautions, H&P , NPO status , Patient's Chart, lab work & pertinent test results  Airway Mallampati: II   Neck ROM: full    Dental   Pulmonary COPD, former smoker   breath sounds clear to auscultation       Cardiovascular + Past MI, + CABG and +CHF  + dysrhythmias Atrial Fibrillation  Rhythm:irregular Rate:Normal     Neuro/Psych    GI/Hepatic hiatal hernia, PUD,,,  Endo/Other  Hypothyroidism  Class 3 obesity  Renal/GU ESRFRenal disease     Musculoskeletal   Abdominal   Peds  Hematology   Anesthesia Other Findings   Reproductive/Obstetrics                              Anesthesia Physical Anesthesia Plan  ASA: 3  Anesthesia Plan: MAC   Post-op Pain Management:    Induction: Intravenous  PONV Risk Score and Plan: 2 and Propofol  infusion and Treatment may vary due to age or medical condition  Airway Management Planned: Nasal Cannula  Additional Equipment:   Intra-op Plan:   Post-operative Plan:   Informed Consent: I have reviewed the patients History and Physical, chart, labs and discussed the procedure including the risks, benefits and alternatives for the proposed anesthesia with the patient or authorized representative who has indicated his/her understanding and acceptance.     Dental advisory given  Plan Discussed with: CRNA, Anesthesiologist and Surgeon  Anesthesia Plan Comments:         Anesthesia Quick Evaluation  "

## 2024-09-19 ENCOUNTER — Telehealth (HOSPITAL_COMMUNITY): Payer: Self-pay | Admitting: Cardiology

## 2024-09-19 NOTE — Telephone Encounter (Signed)
-----   Message from Toribio Fuel, MD sent at 09/10/2024  9:24 PM EST ----- Please stop digoxin  (elevated level)

## 2024-09-20 ENCOUNTER — Telehealth (HOSPITAL_COMMUNITY): Payer: Self-pay | Admitting: *Deleted

## 2024-09-20 NOTE — Telephone Encounter (Signed)
 Called pt to f/u with INR reading from 1/8 pt reports it was 1.9 and PCP increase coumadin  to 5 mg for 1 dose, then resume 2.5 mg daily. Pt's tee/dccv was cx'd 1/6 due to subtherapeutic INR and is now sch for [facility] 1/15. Discussed all with Duwaine Plant, pharm D, advised pt to take 5 mg tonight then resume to 2.5 mg daily. Pt aware, agreeable, and verbalized understanding.

## 2024-09-20 NOTE — Telephone Encounter (Signed)
 Pt aware and voiced understanding

## 2024-09-22 ENCOUNTER — Ambulatory Visit (HOSPITAL_COMMUNITY): Admitting: Certified Registered Nurse Anesthetist

## 2024-09-22 ENCOUNTER — Ambulatory Visit (HOSPITAL_COMMUNITY)
Admission: RE | Admit: 2024-09-22 | Discharge: 2024-09-22 | Disposition: A | Discharge status: Home / Self Care | Attending: Internal Medicine | Admitting: Internal Medicine

## 2024-09-22 ENCOUNTER — Ambulatory Visit (HOSPITAL_COMMUNITY)

## 2024-09-22 ENCOUNTER — Other Ambulatory Visit: Payer: Self-pay

## 2024-09-22 ENCOUNTER — Encounter (HOSPITAL_COMMUNITY)
Admission: RE | Disposition: A | Discharge status: Home / Self Care | Payer: Self-pay | Source: Home / Self Care | Attending: Internal Medicine

## 2024-09-22 ENCOUNTER — Encounter (HOSPITAL_COMMUNITY): Payer: Self-pay | Admitting: Internal Medicine

## 2024-09-22 DIAGNOSIS — I3139 Other pericardial effusion (noninflammatory): Secondary | ICD-10-CM | POA: Insufficient documentation

## 2024-09-22 DIAGNOSIS — Z87891 Personal history of nicotine dependence: Secondary | ICD-10-CM

## 2024-09-22 DIAGNOSIS — I5022 Chronic systolic (congestive) heart failure: Secondary | ICD-10-CM | POA: Insufficient documentation

## 2024-09-22 DIAGNOSIS — Z7901 Long term (current) use of anticoagulants: Secondary | ICD-10-CM | POA: Diagnosis not present

## 2024-09-22 DIAGNOSIS — N1832 Chronic kidney disease, stage 3b: Secondary | ICD-10-CM | POA: Insufficient documentation

## 2024-09-22 DIAGNOSIS — I2510 Atherosclerotic heart disease of native coronary artery without angina pectoris: Secondary | ICD-10-CM | POA: Diagnosis not present

## 2024-09-22 DIAGNOSIS — I4819 Other persistent atrial fibrillation: Secondary | ICD-10-CM | POA: Diagnosis present

## 2024-09-22 DIAGNOSIS — I700 Atherosclerosis of aorta: Secondary | ICD-10-CM | POA: Insufficient documentation

## 2024-09-22 DIAGNOSIS — I340 Nonrheumatic mitral (valve) insufficiency: Secondary | ICD-10-CM

## 2024-09-22 DIAGNOSIS — J449 Chronic obstructive pulmonary disease, unspecified: Secondary | ICD-10-CM | POA: Diagnosis not present

## 2024-09-22 DIAGNOSIS — I252 Old myocardial infarction: Secondary | ICD-10-CM | POA: Diagnosis not present

## 2024-09-22 DIAGNOSIS — I4891 Unspecified atrial fibrillation: Secondary | ICD-10-CM

## 2024-09-22 DIAGNOSIS — I081 Rheumatic disorders of both mitral and tricuspid valves: Secondary | ICD-10-CM | POA: Insufficient documentation

## 2024-09-22 DIAGNOSIS — Z6841 Body Mass Index (BMI) 40.0 and over, adult: Secondary | ICD-10-CM | POA: Insufficient documentation

## 2024-09-22 DIAGNOSIS — Z951 Presence of aortocoronary bypass graft: Secondary | ICD-10-CM | POA: Insufficient documentation

## 2024-09-22 DIAGNOSIS — J9601 Acute respiratory failure with hypoxia: Secondary | ICD-10-CM

## 2024-09-22 DIAGNOSIS — Z79899 Other long term (current) drug therapy: Secondary | ICD-10-CM | POA: Insufficient documentation

## 2024-09-22 DIAGNOSIS — I5021 Acute systolic (congestive) heart failure: Secondary | ICD-10-CM

## 2024-09-22 HISTORY — PX: TRANSESOPHAGEAL ECHOCARDIOGRAM (CATH LAB): EP1270

## 2024-09-22 HISTORY — PX: CARDIOVERSION: EP1203

## 2024-09-22 LAB — PROTIME-INR
INR: 2.4 — ABNORMAL HIGH (ref 0.8–1.2)
Prothrombin Time: 27.1 s — ABNORMAL HIGH (ref 11.4–15.2)

## 2024-09-22 MED ORDER — SODIUM CHLORIDE 0.9 % IV SOLN: INTRAVENOUS | Status: DC

## 2024-09-22 MED ORDER — PROPOFOL 10 MG/ML IV BOLUS
INTRAVENOUS | Status: DC | PRN
  Administered 2024-09-22: 20 mg via INTRAVENOUS

## 2024-09-22 MED ORDER — BUTAMBEN-TETRACAINE-BENZOCAINE 2-2-14 % EX AERO
INHALATION_SPRAY | CUTANEOUS | Status: AC
  Filled 2024-09-22: qty 20

## 2024-09-22 MED ORDER — ETOMIDATE 2 MG/ML IV SOLN
INTRAVENOUS | Status: DC | PRN
  Administered 2024-09-22: 2 mg via INTRAVENOUS
  Administered 2024-09-22: 4 mg via INTRAVENOUS

## 2024-09-22 MED ORDER — FENTANYL CITRATE (PF) 100 MCG/2ML IJ SOLN
INTRAMUSCULAR | Status: AC
  Filled 2024-09-22: qty 2

## 2024-09-22 MED ORDER — FENTANYL CITRATE (PF) 100 MCG/2ML IJ SOLN
INTRAMUSCULAR | Status: DC | PRN
  Administered 2024-09-22: 25 ug via INTRAVENOUS

## 2024-09-22 MED ORDER — PROPOFOL 500 MG/50ML IV EMUL
INTRAVENOUS | Status: DC | PRN
  Administered 2024-09-22: 50 ug/kg/min via INTRAVENOUS

## 2024-09-22 NOTE — Anesthesia Postprocedure Evaluation (Signed)
"   Anesthesia Post Note  Patient: Laura Lane  Procedure(s) Performed: TRANSESOPHAGEAL ECHOCARDIOGRAM CARDIOVERSION     Patient location during evaluation: Cath Lab Anesthesia Type: MAC Level of consciousness: awake and alert Pain management: pain level controlled Vital Signs Assessment: post-procedure vital signs reviewed and stable Respiratory status: spontaneous breathing, nonlabored ventilation, respiratory function stable and patient connected to nasal cannula oxygen  Cardiovascular status: stable and blood pressure returned to baseline Postop Assessment: no apparent nausea or vomiting Anesthetic complications: no   No notable events documented.  Last Vitals:  Vitals:   09/22/24 0803 09/22/24 0806  BP:  109/62  Pulse: (!) 57 (!) 57  Resp: (!) 23 (!) 26  Temp:    SpO2:  95%    Last Pain:  Vitals:   09/22/24 0806  TempSrc:   PainSc: 0-No pain                 Rome Ade      "

## 2024-09-22 NOTE — Transfer of Care (Signed)
 Immediate Anesthesia Transfer of Care Note  Patient: Laura Lane  Procedure(s) Performed: TRANSESOPHAGEAL ECHOCARDIOGRAM CARDIOVERSION  Patient Location: Cath Lab  Anesthesia Type:MAC  Level of Consciousness: drowsy and patient cooperative  Airway & Oxygen  Therapy: Patient Spontanous Breathing and Patient connected to nasal cannula oxygen   Post-op Assessment: Report given to RN, Post -op Vital signs reviewed and stable, and Patient moving all extremities X 4  Post vital signs: Reviewed and stable  Last Vitals:  Vitals Value Taken Time  BP 109/62   Temp    Pulse 57 09/22/24 08:03  Resp 21 09/22/24 08:04  SpO2 95   Vitals shown include unfiled device data.  Last Pain:  Vitals:   09/22/24 0803  TempSrc:   PainSc: Asleep         Complications: No notable events documented.

## 2024-09-22 NOTE — CV Procedure (Signed)
" ° °  TRANSESOPHAGEAL ECHOCARDIOGRAM GUIDED DIRECT CURRENT CARDIOVERSION  NAME:  Laura Lane   MRN: [id number] DOB:  08/19/1956   ADMIT DATE: 09/22/2024  INDICATIONS:  Atrial fibrillation  PROCEDURE:   Informed consent was obtained prior to the procedure. The risks, benefits and alternatives for the procedure were discussed and the patient comprehended these risks.  Risks include, but are not limited to, cough, sore throat, vomiting, nausea, somnolence, esophageal and stomach trauma or perforation, bleeding, low blood pressure, aspiration, pneumonia, infection, trauma to the teeth and death.    After a procedural time-out, the oropharynx was anesthetized and the patient was sedated by the anesthesia service. The transesophageal probe was inserted in the esophagus and stomach without difficulty and multiple views were obtained.   FINDINGS:  LEFT VENTRICLE: EF = 30-35%  RIGHT VENTRICLE: Normal  LEFT ATRIUM: Moderately dilated  LEFT ATRIAL APPENDAGE: Previously clipped. Small. No clot  RIGHT ATRIUM: Mildly dilated  AORTIC VALVE:  Trileaflet. Normal  MITRAL VALVE:    Triv MR  TRICUSPID VALVE: Triv TR  PULMONIC VALVE: Triv PR  INTERATRIAL SEPTUM: Lipomatous hypertrophy  PERICARDIUM: Small effusion along RV  DESCENDING AORTA: Mild plaque   CARDIOVERSION:     Indications:  Atrial Fibrillation  Procedure Details:  Once the TEE was complete, the patient had the defibrillator pads placed in the anterior and posterior position. Once an appropriate level of sedation was achieved, the patient received a single biphasic, synchronized 200J shock with prompt conversion to sinus rhythm. No apparent complications.   Toribio Fuel, MD  8:10 AM     "

## 2024-09-22 NOTE — Anesthesia Preprocedure Evaluation (Signed)
 "                                  Anesthesia Evaluation  Patient identified by MRN, date of birth, ID band Patient awake    Reviewed: Allergy & Precautions, H&P , NPO status , Patient's Chart, lab work & pertinent test results  History of Anesthesia Complications Negative for: history of anesthetic complications  Airway Mallampati: III   Neck ROM: full    Dental  (+) Upper Dentures, Partial Lower   Pulmonary neg sleep apnea, COPD, Patient abstained from smoking.Not current smoker, former smoker    + decreased breath sounds      Cardiovascular Exercise Tolerance: Poor METS(-) hypertension+ Past MI, + CABG and +CHF  (-) CAD + dysrhythmias Atrial Fibrillation  Rhythm:irregular Rate:Normal  Per Dr Bensimohn's last note: 1. Chronic systolic HF with biventricular dysfunction - suspect mostly iCM. S/p CABG 2015. But may also have component of NICM - Echo 10/25 EF 20-25% G1 DD RV severely HK. Valves ok. [facility] [place]) - NYHA IIIB-IV  - Volume overloaded. BP low - Stop carvedilol - Add digoxin  0.0625mg  daily - Continue eplerenone 25 - Continue olmesartan 20 - Continue torsemide  40 daily - Eventual SGLT2i - Furoscix  80mg  + 40KCl x 3 days - Compression hose - RTC next week - Eventually will need R/L cath - Management of AF as below - Currently not candidate for advanced therapies    Neuro/Psych negative neurological ROS  negative psych ROS   GI/Hepatic hiatal hernia, PUD,neg GERD  ,,(+)     (-) substance abuse    Endo/Other  neg diabetesHypothyroidism  Class 3 obesity  Renal/GU ESRFRenal disease     Musculoskeletal   Abdominal  (+) + obese  Peds  Hematology   Anesthesia Other Findings Past Medical History: No date: Cataracts, bilateral No date: CHF (congestive heart failure) (HCC) No date: COPD (chronic obstructive pulmonary disease) (HCC) No date: Hx of CABG No date: Hypothyroid No date: MI (myocardial infarction) (HCC) No date: Renal  disorder No date: Thyroid  disease  Reproductive/Obstetrics                              Anesthesia Physical Anesthesia Plan  ASA: 4  Anesthesia Plan: MAC   Post-op Pain Management: Minimal or no pain anticipated   Induction: Intravenous  PONV Risk Score and Plan: 2 and Propofol  infusion, Treatment may vary due to age or medical condition, Ondansetron  and TIVA  Airway Management Planned: Natural Airway and Simple Face Mask  Additional Equipment: None  Intra-op Plan:   Post-operative Plan:   Informed Consent: I have reviewed the patients History and Physical, chart, labs and discussed the procedure including the risks, benefits and alternatives for the proposed anesthesia with the patient or authorized representative who has indicated his/her understanding and acceptance.     Dental advisory given  Plan Discussed with: CRNA, Anesthesiologist and Surgeon  Anesthesia Plan Comments: (Discussed risks of anesthesia with patient, including possibility of difficulty with spontaneous ventilation under anesthesia necessitating airway intervention, PONV, and rare risks such as cardiac or respiratory or neurological events, and allergic reactions. Discussed the role of CRNA in patient's perioperative care. Patient understands. Patient counseled on being higher risk for anesthesia due to comorbidities: class 3 obesity, HFrEF. Patient was told about increased risk of cardiac and respiratory events, including death. )  Anesthesia Quick Evaluation  "

## 2024-09-22 NOTE — Interval H&P Note (Signed)
 History and Physical Interval Note:  09/22/2024 7:48 AM  Laura Lane  has presented today for surgery, with the diagnosis of AFIB.  The various methods of treatment have been discussed with the patient and family. After consideration of risks, benefits and other options for treatment, the patient has consented to  Procedures: TRANSESOPHAGEAL ECHOCARDIOGRAM (N/A) CARDIOVERSION (N/A) as a surgical intervention.  The patient's history has been reviewed, patient examined, no change in status, stable for surgery.  I have reviewed the patient's chart and labs.  Questions were answered to the patient's satisfaction.     [name]

## 2024-09-27 LAB — ECHO TEE

## 2024-09-29 ENCOUNTER — Telehealth (HOSPITAL_COMMUNITY): Payer: Self-pay

## 2024-09-29 NOTE — Telephone Encounter (Signed)
 No pre cert required for home sleep study.

## 2024-11-22 ENCOUNTER — Ambulatory Visit (HOSPITAL_COMMUNITY)
